# Patient Record
Sex: Female | Born: 1980 | Race: White | Hispanic: No | Marital: Married | Smoking: Never smoker
Health system: Southern US, Community
[De-identification: ages and names within clinical notes are randomized; demographics above are authoritative.]

## PROBLEM LIST (undated history)

## (undated) ENCOUNTER — Inpatient Hospital Stay (HOSPITAL_COMMUNITY): Payer: Self-pay

## (undated) DIAGNOSIS — K5900 Constipation, unspecified: Secondary | ICD-10-CM

## (undated) DIAGNOSIS — Z8619 Personal history of other infectious and parasitic diseases: Secondary | ICD-10-CM

## (undated) DIAGNOSIS — E7212 Methylenetetrahydrofolate reductase deficiency: Secondary | ICD-10-CM

## (undated) DIAGNOSIS — E039 Hypothyroidism, unspecified: Secondary | ICD-10-CM

## (undated) DIAGNOSIS — K760 Fatty (change of) liver, not elsewhere classified: Secondary | ICD-10-CM

## (undated) DIAGNOSIS — E663 Overweight: Secondary | ICD-10-CM

## (undated) DIAGNOSIS — M7989 Other specified soft tissue disorders: Secondary | ICD-10-CM

## (undated) DIAGNOSIS — F419 Anxiety disorder, unspecified: Secondary | ICD-10-CM

## (undated) DIAGNOSIS — M549 Dorsalgia, unspecified: Secondary | ICD-10-CM

## (undated) DIAGNOSIS — O24419 Gestational diabetes mellitus in pregnancy, unspecified control: Secondary | ICD-10-CM

## (undated) DIAGNOSIS — E559 Vitamin D deficiency, unspecified: Secondary | ICD-10-CM

## (undated) HISTORY — DX: Gestational diabetes mellitus in pregnancy, unspecified control: O24.419

## (undated) HISTORY — DX: Fatty (change of) liver, not elsewhere classified: K76.0

## (undated) HISTORY — DX: Vitamin D deficiency, unspecified: E55.9

## (undated) HISTORY — DX: Dorsalgia, unspecified: M54.9

## (undated) HISTORY — DX: Other specified soft tissue disorders: M79.89

## (undated) HISTORY — DX: Anxiety disorder, unspecified: F41.9

## (undated) HISTORY — DX: Personal history of other infectious and parasitic diseases: Z86.19

## (undated) HISTORY — DX: Constipation, unspecified: K59.00

## (undated) HISTORY — DX: Overweight: E66.3

---

## 2003-08-13 ENCOUNTER — Ambulatory Visit (HOSPITAL_COMMUNITY): Admission: RE | Admit: 2003-08-13 | Discharge: 2003-08-13 | Payer: Self-pay | Admitting: Gastroenterology

## 2004-05-31 ENCOUNTER — Other Ambulatory Visit: Admission: RE | Admit: 2004-05-31 | Discharge: 2004-05-31 | Payer: Self-pay | Admitting: Obstetrics and Gynecology

## 2004-06-01 ENCOUNTER — Other Ambulatory Visit: Admission: RE | Admit: 2004-06-01 | Discharge: 2004-06-01 | Payer: Self-pay | Admitting: Obstetrics and Gynecology

## 2004-08-23 ENCOUNTER — Encounter: Admission: RE | Admit: 2004-08-23 | Discharge: 2004-08-23 | Payer: Self-pay | Admitting: Internal Medicine

## 2004-12-24 ENCOUNTER — Emergency Department (HOSPITAL_COMMUNITY): Admission: EM | Admit: 2004-12-24 | Discharge: 2004-12-24 | Payer: Self-pay | Admitting: Emergency Medicine

## 2005-04-25 ENCOUNTER — Other Ambulatory Visit: Admission: RE | Admit: 2005-04-25 | Discharge: 2005-04-25 | Payer: Self-pay | Admitting: Obstetrics and Gynecology

## 2005-07-18 HISTORY — PX: WISDOM TOOTH EXTRACTION: SHX21

## 2010-07-18 DIAGNOSIS — Z1589 Genetic susceptibility to other disease: Secondary | ICD-10-CM

## 2010-07-18 HISTORY — DX: Genetic susceptibility to other disease: Z15.89

## 2013-08-08 ENCOUNTER — Emergency Department (INDEPENDENT_AMBULATORY_CARE_PROVIDER_SITE_OTHER)
Admission: EM | Admit: 2013-08-08 | Discharge: 2013-08-08 | Disposition: A | Discharge status: Home / Self Care | Payer: Worker's Compensation | Source: Home / Self Care | Attending: Family Medicine | Admitting: Family Medicine

## 2013-08-08 ENCOUNTER — Encounter (HOSPITAL_COMMUNITY): Payer: Self-pay | Admitting: Emergency Medicine

## 2013-08-08 DIAGNOSIS — S239XXA Sprain of unspecified parts of thorax, initial encounter: Secondary | ICD-10-CM

## 2013-08-08 DIAGNOSIS — S29019A Strain of muscle and tendon of unspecified wall of thorax, initial encounter: Secondary | ICD-10-CM

## 2013-08-08 MED ORDER — METHOCARBAMOL 500 MG PO TABS: 500.0000 mg | ORAL_TABLET | Freq: Two times a day (BID) | ORAL | Status: DC

## 2013-08-08 MED ORDER — IBUPROFEN 800 MG PO TABS: 800.0000 mg | ORAL_TABLET | Freq: Three times a day (TID) | ORAL | Status: DC

## 2013-08-08 NOTE — ED Notes (Signed)
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

## 2013-08-08 NOTE — Discharge Instructions (Signed)
Back Pain, Adult Low back pain is very common. About 1 in 5 people have back pain.The cause of low back pain is rarely dangerous. The pain often gets better over time.About half of people with a sudden onset of back pain feel better in just 2 weeks. About 8 in 10 people feel better by 6 weeks.  CAUSES Some common causes of back pain include:  Strain of the muscles or ligaments supporting the spine.  Wear and tear (degeneration) of the spinal discs.  Arthritis.  Direct injury to the back. DIAGNOSIS Most of the time, the direct cause of low back pain is not known.However, back pain can be treated effectively even when the exact cause of the pain is unknown.Answering your caregiver's questions about your overall health and symptoms is one of the most accurate ways to make sure the cause of your pain is not dangerous. If your caregiver needs more information, he or she may order lab work or imaging tests (X-rays or MRIs).However, even if imaging tests show changes in your back, this usually does not require surgery. HOME CARE INSTRUCTIONS For many people, back pain returns.Since low back pain is rarely dangerous, it is often a condition that people can learn to manageon their own.   Remain active. It is stressful on the back to sit or stand in one place. Do not sit, drive, or stand in one place for more than 30 minutes at a time. Take short walks on level surfaces as soon as pain allows.Try to increase the length of time you walk each day.  Do not stay in bed.Resting more than 1 or 2 days can delay your recovery.  Do not avoid exercise or work.Your body is made to move.It is not dangerous to be active, even though your back may hurt.Your back will likely heal faster if you return to being active before your pain is gone.  Pay attention to your body when you bend and lift. Many people have less discomfortwhen lifting if they bend their knees, keep the load close to their bodies,and  avoid twisting. Often, the most comfortable positions are those that put less stress on your recovering back.  Find a comfortable position to sleep. Use a firm mattress and lie on your side with your knees slightly bent. If you lie on your back, put a pillow under your knees.  Only take over-the-counter or prescription medicines as directed by your caregiver. Over-the-counter medicines to reduce pain and inflammation are often the most helpful.Your caregiver may prescribe muscle relaxant drugs.These medicines help dull your pain so you can more quickly return to your normal activities and healthy exercise.  Put ice on the injured area.  Put ice in a plastic bag.  Place a towel between your skin and the bag.  Leave the ice on for 15-20 minutes, 03-04 times a day for the first 2 to 3 days. After that, ice and heat may be alternated to reduce pain and spasms.  Ask your caregiver about trying back exercises and gentle massage. This may be of some benefit.  Avoid feeling anxious or stressed.Stress increases muscle tension and can worsen back pain.It is important to recognize when you are anxious or stressed and learn ways to manage it.Exercise is a great option. SEEK MEDICAL CARE IF:  You have pain that is not relieved with rest or medicine.  You have pain that does not improve in 1 week.  You have new symptoms.  You are generally not feeling well. SEEK   IMMEDIATE MEDICAL CARE IF:   You have pain that radiates from your back into your legs.  You develop new bowel or bladder control problems.  You have unusual weakness or numbness in your arms or legs.  You develop nausea or vomiting.  You develop abdominal pain.  You feel faint. Document Released: 07/04/2005 Document Revised: 01/03/2012 Document Reviewed: 11/22/2010 ExitCare Patient Information 2014 [employer].  

## 2013-08-08 NOTE — ED Provider Notes (Signed)
Medical screening examination/treatment/procedure(s) were performed by resident physician or non-physician practitioner and as supervising physician I was immediately available for consultation/collaboration.   Parveen Freehling DOUGLAS MD.   Jovonte Commins D Marin Milley, MD 08/08/13 2003 

## 2013-08-08 NOTE — ED Provider Notes (Signed)
CSN: [id number]     Arrival date & time 08/08/13  1648 History   First MD Initiated Contact with Patient 08/08/13 1803     Chief Complaint  Patient presents with  . Back Injury   (Consider location/radiation/quality/duration/timing/severity/associated sxs/prior Treatment) Patient is a 33 y.o. female presenting with back pain. The history is provided by the patient. No language interpreter was used.  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 History reviewed. No pertinent past medical history. No past surgical history on file. No family history on file. History  Substance Use Topics  . Smoking status: Not on file  . Smokeless tobacco: Not on file  . Alcohol Use: Not on file   OB History   Grav Para Term Preterm Abortions TAB SAB Ect Mult Living                 Review of Systems  Musculoskeletal: Positive for back pain.  All other systems reviewed and are negative.    Allergies  Review of patient's allergies indicates no known allergies.  Home Medications  No current outpatient prescriptions on file. BP 116/75  Pulse 80  Temp(Src) 98.7 F (37.1 C) (Oral)  Resp 16  SpO2 100%  LMP 07/13/2013 Physical Exam  Nursing note and vitals reviewed. Constitutional: She is oriented to person, place, and time. She appears well-developed and well-nourished.  HENT:  Head: Normocephalic and atraumatic.  Nose: Nose normal.  Mouth/Throat: Oropharynx is clear and moist.  Eyes: EOM are normal. Pupils are equal, round, and reactive to light.  Neck: Normal range of motion.  Cardiovascular: Normal rate.   Pulmonary/Chest: Effort normal and breath sounds normal.  Abdominal:  Soft. Bowel sounds are normal. She exhibits no distension.  Musculoskeletal: Normal range of motion.  Neurological: She is alert and oriented to person, place, and time.  Skin: Skin is warm.  Psychiatric: She has a normal mood and affect.    ED Course  Procedures (including critical care time) Labs Review Labs Reviewed - No data to display Imaging Review No results found.  EKG Interpretation    Date/Time:    Ventricular Rate:    PR Interval:    QRS Duration:   QT Interval:    QTC Calculation:   R Axis:     Text Interpretation:              MDM   1. Thoracic myofascial strain    Pt given rx for ibuprofen and robaxin.  Pt advised light duty.   Recheck in 1 week if not improved   Elson AreasLeslie K Patrisia Faeth, [place] JerseyPA-C 08/08/13 1836

## 2014-04-09 LAB — OB RESULTS CONSOLE RPR: RPR: NONREACTIVE

## 2014-04-09 LAB — OB RESULTS CONSOLE ANTIBODY SCREEN: Antibody Screen: NEGATIVE

## 2014-04-09 LAB — OB RESULTS CONSOLE RUBELLA ANTIBODY, IGM: RUBELLA: IMMUNE

## 2014-04-09 LAB — OB RESULTS CONSOLE ABO/RH: RH Type: NEGATIVE

## 2014-04-09 LAB — OB RESULTS CONSOLE HIV ANTIBODY (ROUTINE TESTING): HIV: NONREACTIVE

## 2014-04-09 LAB — OB RESULTS CONSOLE HEPATITIS B SURFACE ANTIGEN: Hepatitis B Surface Ag: NEGATIVE

## 2014-04-09 LAB — OB RESULTS CONSOLE GC/CHLAMYDIA
CHLAMYDIA, DNA PROBE: NEGATIVE
Gonorrhea: NEGATIVE

## 2014-04-09 LAB — OB RESULTS CONSOLE VARICELLA ZOSTER ANTIBODY, IGG: Varicella: IMMUNE

## 2014-07-04 ENCOUNTER — Inpatient Hospital Stay (HOSPITAL_COMMUNITY)
Admission: AD | Admit: 2014-07-04 | Discharge: 2014-07-04 | Disposition: A | Discharge status: Home / Self Care | Payer: Medicaid Other | Source: Ambulatory Visit | Attending: Obstetrics and Gynecology | Admitting: Obstetrics and Gynecology

## 2014-07-04 ENCOUNTER — Encounter (HOSPITAL_COMMUNITY): Payer: Self-pay | Admitting: *Deleted

## 2014-07-04 DIAGNOSIS — E669 Obesity, unspecified: Secondary | ICD-10-CM | POA: Diagnosis present

## 2014-07-04 DIAGNOSIS — Z3A20 20 weeks gestation of pregnancy: Secondary | ICD-10-CM | POA: Insufficient documentation

## 2014-07-04 DIAGNOSIS — Z1589 Genetic susceptibility to other disease: Secondary | ICD-10-CM | POA: Diagnosis present

## 2014-07-04 DIAGNOSIS — M79606 Pain in leg, unspecified: Secondary | ICD-10-CM

## 2014-07-04 DIAGNOSIS — M25561 Pain in right knee: Secondary | ICD-10-CM | POA: Diagnosis present

## 2014-07-04 DIAGNOSIS — O360920 Maternal care for other rhesus isoimmunization, second trimester, not applicable or unspecified: Secondary | ICD-10-CM | POA: Diagnosis not present

## 2014-07-04 DIAGNOSIS — M25569 Pain in unspecified knee: Secondary | ICD-10-CM | POA: Diagnosis present

## 2014-07-04 DIAGNOSIS — O26899 Other specified pregnancy related conditions, unspecified trimester: Secondary | ICD-10-CM

## 2014-07-04 DIAGNOSIS — E039 Hypothyroidism, unspecified: Secondary | ICD-10-CM | POA: Diagnosis not present

## 2014-07-04 DIAGNOSIS — O9989 Other specified diseases and conditions complicating pregnancy, childbirth and the puerperium: Secondary | ICD-10-CM | POA: Insufficient documentation

## 2014-07-04 DIAGNOSIS — Z6791 Unspecified blood type, Rh negative: Secondary | ICD-10-CM | POA: Diagnosis present

## 2014-07-04 DIAGNOSIS — O99212 Obesity complicating pregnancy, second trimester: Secondary | ICD-10-CM | POA: Diagnosis not present

## 2014-07-04 DIAGNOSIS — E559 Vitamin D deficiency, unspecified: Secondary | ICD-10-CM | POA: Diagnosis not present

## 2014-07-04 DIAGNOSIS — E7212 Methylenetetrahydrofolate reductase deficiency: Secondary | ICD-10-CM | POA: Diagnosis present

## 2014-07-04 DIAGNOSIS — O99282 Endocrine, nutritional and metabolic diseases complicating pregnancy, second trimester: Secondary | ICD-10-CM | POA: Insufficient documentation

## 2014-07-04 HISTORY — DX: Methylenetetrahydrofolate reductase deficiency: E72.12

## 2014-07-04 HISTORY — DX: Hypothyroidism, unspecified: E03.9

## 2014-07-04 NOTE — Discharge Instructions (Signed)
Take Ibuprophen 600 mg by mouth (3 200 mg tabs) every 6 hours for 24-36 hours. Apply topical gels/rubs for comfort. Call with any worsening of symptoms.  Knee Pain The knee is the complex joint between your thigh and your lower leg. It is made up of bones, tendons, ligaments, and cartilage. The bones that make up the knee are:  The femur in the thigh.  The tibia and fibula in the lower leg.  The patella or kneecap riding in the groove on the lower femur. CAUSES  Knee pain is a common complaint with many causes. A few of these causes are:  Injury, such as:  A ruptured ligament or tendon injury.  Torn cartilage.  Medical conditions, such as:  Gout  Arthritis  Infections  Overuse, over training, or overdoing a physical activity. Knee pain can be minor or severe. Knee pain can accompany debilitating injury. Minor knee problems often respond well to self-care measures or get well on their own. More serious injuries may need medical intervention or even surgery. SYMPTOMS The knee is complex. Symptoms of knee problems can vary widely. Some of the problems are:  Pain with movement and weight bearing.  Swelling and tenderness.  Buckling of the knee.  Inability to straighten or extend your knee.  Your knee locks and you cannot straighten it.  Warmth and redness with pain and fever.  Deformity or dislocation of the kneecap. TREATMENT The treatment of knee problems depends on the cause. Some of these treatments are:  Depending on the injury, proper casting, splinting, surgery, or physical therapy care will be needed.  Give yourself adequate recovery time. Do not overuse your joints. If you begin to get sore during workout routines, back off. Slow down or do fewer repetitions.  For repetitive activities such as cycling or running, maintain your strength and nutrition.  Alternate muscle groups. For example, if you are a weight lifter, work the upper body on one day and the  lower body the next.  Either tight or weak muscles do not give the proper support for your knee. Tight or weak muscles do not absorb the stress placed on the knee joint. Keep the muscles surrounding the knee strong.  Take care of mechanical problems.  If you have flat feet, orthotics or special shoes may help. See your caregiver if you need help.  Arch supports, sometimes with wedges on the inner or outer aspect of the heel, can help. These can shift pressure away from the side of the knee most bothered by osteoarthritis.  A brace called an "unloader" brace also may be used to help ease the pressure on the most arthritic side of the knee.  If your caregiver has prescribed crutches, braces, wraps or ice, use as directed. The acronym for this is PRICE. This means protection, rest, ice, compression, and elevation.  Nonsteroidal anti-inflammatory drugs (NSAIDs), can help relieve pain. But if taken immediately after an injury, they may actually increase swelling. Take NSAIDs with food in your stomach. Stop them if you develop stomach problems. Do not take these if you have a history of ulcers, stomach pain, or bleeding from the bowel. Do not take without your caregiver's approval if you have problems with fluid retention, heart failure, or kidney problems.  For ongoing knee problems, physical therapy may be helpful.  Topical painkillers. Applying certain ointments to your skin may help relieve the pain and stiffness of osteoarthritis. Ask your pharmacist for suggestions. Many over the-counter products are approved for temporary  relief of arthritis pain.  In some countries, doctors often prescribe topical NSAIDs for relief of chronic conditions such as arthritis and tendinitis. A review of treatment with NSAID creams found that they worked as well as oral medications but without the serious side effects. PREVENTION  Maintain a healthy weight. Extra pounds put more strain on your joints.  Get  strong, stay limber. Weak muscles are a common cause of knee injuries. Stretching is important. Include flexibility exercises in your workouts.  Be smart about exercise. If you have osteoarthritis, chronic knee pain or recurring injuries, you may need to change the way you exercise. This does not mean you have to stop being active. If your knees ache after jogging or playing basketball, consider switching to swimming, water aerobics, or other low-impact activities, at least for a few days a week. Sometimes limiting high-impact activities will provide relief.  Make sure your shoes fit well. Choose footwear that is right for your sport.  Protect your knees. Use the proper gear for knee-sensitive activities. Use kneepads when playing volleyball or laying carpet. Buckle your seat belt every time you drive. Most shattered kneecaps occur in car accidents.  Rest when you are tired. SEEK MEDICAL CARE IF:  You have knee pain that is continual and does not seem to be getting better.  SEEK IMMEDIATE MEDICAL CARE IF:  Your knee joint feels hot to the touch and you have a high fever. MAKE SURE YOU:   Understand these instructions.  Will watch your condition.  Will get help right away if you are not doing well or get worse. Document Released: 05/01/2007 Document Revised: 09/26/2011 Document Reviewed: 05/01/2007 [facility] Patient Information 2015 [place], [place]. This information is not intended to replace advice given to you by your health care provider. Make sure you discuss any questions you have with your health care provider.

## 2014-07-04 NOTE — MAU Note (Signed)
Pain behind R knee for last 2 days, pain became worse last night & is continuous - also hurts into thigh & calf today, concerned about DVT.  Denies uc's, bleeding, or LOF.

## 2014-07-04 NOTE — Progress Notes (Signed)
VASCULAR LAB PRELIMINARY  PRELIMINARY  PRELIMINARY  PRELIMINARY  Right lower extremity venous duplex completed.    Preliminary report:  Right:  No evidence of DVT, superficial thrombosis, or Baker's cyst.  Randale Carvalho, RVS 07/04/2014, 5:34 PM

## 2014-07-04 NOTE — MAU Note (Signed)
Urine in lab 

## 2014-07-04 NOTE — MAU Provider Note (Signed)
History   33 yo G1P0 at 7120 5/7 weeks presented unannounced c/o pain behind right knee.  Started yesterday as intermittent soreness, more constant today.  More noticeable with standing.  Denies edema or trauma to area.  Hx of similar sx in left knee prior to pregnancy.  "Concerned about DVT".  Patient Active Problem List   Diagnosis Date Noted  . Knee pain 07/04/2014  . Hypothyroidism 07/04/2014  . 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 Vitamin D deficiency 07/04/2014  . Obesity 07/04/2014  . Rh negative state in antepartum period 07/04/2014    Chief Complaint  Patient presents with  . Leg Pain   HPI:  See above  OB History    Gravida Para Term Preterm AB TAB SAB Ectopic Multiple Living   1               Past Medical History  Diagnosis Date  . Hypothyroidism   . Homozygous for MTHFR gene mutation 2012    Past Surgical History  Procedure Laterality Date  . Wisdom tooth extraction Left 2007    History reviewed. No pertinent family history.  History  Substance Use Topics  . Smoking status: Never Smoker   . Smokeless tobacco: Never Used  . Alcohol Use: No    Allergies: No Known Allergies  No prescriptions prior to admission    ROS:  Pain in right knee, +FM. Physical Exam   Blood pressure 99/63, pulse 88, temperature 98 F (36.7 C), temperature source Oral, resp. rate 18, last menstrual period 07/13/2013, SpO2 99 %.  Physical Exam  In NAD Chest clear Heart RRR without murmur Abd gravid, NT Pelvic deferred Ext--Right leg without edema.  Mildly painful behind right knee over palpable ligament.  Full ROM noted, no swelling of joint.  DTR 1+, no clonus Left leg WNL.   Full pulses bilaterally from femoral to dorsalis pedis  Doppler study:  No evidence of DVT, superficial thrombosis, or Baker's cyst.  ED Course  Assessment: IUP at 20 5/7 weeks Right knee pain--likely ligamentous in origin  Plan: D/C home. Ibuprophen 600 mg po q 6 hours x 24  hours. Topical pain relief gel/rub to area F/u if sx worsen.  May need referral to ortho if persists.   Nigel BridgemanLATHAM, Romonda Parker CNM, MSN 07/04/2014 6:35 PM

## 2014-07-18 NOTE — L&D Delivery Note (Signed)
Delivery Note At 3:41 AM a viable female "Lori Grant" was delivered via Vaginal, Spontaneous Delivery (Presentation: OA restituting to Right Occiput Anterior).  APGARS: ; weight pending.   Placenta status: Spontaneous, intact.  Cord: 3 vessels with the following complications: Thick meconium  Cord pH: NA - crying at birth.  Anesthesia: Local for repair Epidural Episiotomy: None Lacerations: 2nd degree perineal   Suture Repair: 3.0 vicryl SH Est. Blood Loss (mL):    Mom to postpartum.  Baby to Couplet care / Skin to Skin.  Placenta to path due to prolonged ROM, 42.0 wks, thick mec.  Mom breastfeeding.  Undecided re: birth control.  No circumcision.  Lori Grant, Lori Grant 11/30/2014, 4:51 AM

## 2014-07-22 ENCOUNTER — Other Ambulatory Visit: Payer: Self-pay | Admitting: Obstetrics and Gynecology

## 2014-07-22 DIAGNOSIS — E049 Nontoxic goiter, unspecified: Secondary | ICD-10-CM

## 2014-07-25 ENCOUNTER — Ambulatory Visit
Admission: RE | Admit: 2014-07-25 | Discharge: 2014-07-25 | Disposition: A | Discharge status: Home / Self Care | Payer: Medicaid Other | Source: Ambulatory Visit | Attending: Obstetrics and Gynecology | Admitting: Obstetrics and Gynecology

## 2014-07-25 DIAGNOSIS — E049 Nontoxic goiter, unspecified: Secondary | ICD-10-CM

## 2014-07-28 ENCOUNTER — Other Ambulatory Visit: Payer: Self-pay

## 2014-08-15 ENCOUNTER — Encounter (HOSPITAL_COMMUNITY): Payer: Self-pay | Admitting: *Deleted

## 2014-08-15 ENCOUNTER — Inpatient Hospital Stay (HOSPITAL_COMMUNITY)
Admission: AD | Admit: 2014-08-15 | Discharge: 2014-08-15 | Disposition: A | Discharge status: Home / Self Care | Payer: Medicaid Other | Source: Ambulatory Visit | Attending: Obstetrics and Gynecology | Admitting: Obstetrics and Gynecology

## 2014-08-15 DIAGNOSIS — O99612 Diseases of the digestive system complicating pregnancy, second trimester: Secondary | ICD-10-CM | POA: Diagnosis not present

## 2014-08-15 DIAGNOSIS — O26892 Other specified pregnancy related conditions, second trimester: Secondary | ICD-10-CM

## 2014-08-15 DIAGNOSIS — O99282 Endocrine, nutritional and metabolic diseases complicating pregnancy, second trimester: Secondary | ICD-10-CM | POA: Insufficient documentation

## 2014-08-15 DIAGNOSIS — Z3A26 26 weeks gestation of pregnancy: Secondary | ICD-10-CM | POA: Insufficient documentation

## 2014-08-15 DIAGNOSIS — K219 Gastro-esophageal reflux disease without esophagitis: Secondary | ICD-10-CM | POA: Diagnosis not present

## 2014-08-15 DIAGNOSIS — R079 Chest pain, unspecified: Secondary | ICD-10-CM | POA: Diagnosis present

## 2014-08-15 DIAGNOSIS — O360920 Maternal care for other rhesus isoimmunization, second trimester, not applicable or unspecified: Secondary | ICD-10-CM | POA: Insufficient documentation

## 2014-08-15 DIAGNOSIS — E039 Hypothyroidism, unspecified: Secondary | ICD-10-CM | POA: Diagnosis not present

## 2014-08-15 DIAGNOSIS — R12 Heartburn: Secondary | ICD-10-CM

## 2014-08-15 DIAGNOSIS — E559 Vitamin D deficiency, unspecified: Secondary | ICD-10-CM | POA: Insufficient documentation

## 2014-08-15 LAB — URINALYSIS, ROUTINE W REFLEX MICROSCOPIC
BILIRUBIN URINE: NEGATIVE
GLUCOSE, UA: NEGATIVE mg/dL
KETONES UR: NEGATIVE mg/dL
Nitrite: NEGATIVE
Protein, ur: NEGATIVE mg/dL
Specific Gravity, Urine: 1.01 (ref 1.005–1.030)
UROBILINOGEN UA: 0.2 mg/dL (ref 0.0–1.0)
pH: 7 (ref 5.0–8.0)

## 2014-08-15 LAB — COMPREHENSIVE METABOLIC PANEL
ALBUMIN: 3.2 g/dL — AB (ref 3.5–5.2)
ALT: 19 U/L (ref 0–35)
ANION GAP: 6 (ref 5–15)
AST: 16 U/L (ref 0–37)
Alkaline Phosphatase: 90 U/L (ref 39–117)
BILIRUBIN TOTAL: 0.5 mg/dL (ref 0.3–1.2)
BUN: 7 mg/dL (ref 6–23)
CALCIUM: 8.9 mg/dL (ref 8.4–10.5)
CO2: 21 mmol/L (ref 19–32)
Chloride: 108 mmol/L (ref 96–112)
Creatinine, Ser: 0.51 mg/dL (ref 0.50–1.10)
GFR calc Af Amer: >90 mL/min (ref >90)
GLUCOSE: 85 mg/dL (ref 70–99)
POTASSIUM: 3.9 mmol/L (ref 3.5–5.1)
Sodium: 135 mmol/L (ref 135–145)
Total Protein: 6.9 g/dL (ref 6.0–8.3)

## 2014-08-15 LAB — URINE MICROSCOPIC-ADD ON

## 2014-08-15 LAB — CBC
HCT: 34.9 % — ABNORMAL LOW (ref 36.0–46.0)
HEMOGLOBIN: 12.3 g/dL (ref 12.0–15.0)
MCH: 30.8 pg (ref 26.0–34.0)
MCHC: 35.2 g/dL (ref 30.0–36.0)
MCV: 87.3 fL (ref 78.0–100.0)
Platelets: 233 10*3/uL (ref 150–400)
RBC: 4 MIL/uL (ref 3.87–5.11)
RDW: 13.3 % (ref 11.5–15.5)
WBC: 9 10*3/uL (ref 4.0–10.5)

## 2014-08-15 LAB — LIPASE, BLOOD: LIPASE: 31 U/L (ref 11–59)

## 2014-08-15 LAB — AMYLASE: AMYLASE: 70 U/L (ref 0–105)

## 2014-08-15 NOTE — MAU Provider Note (Signed)
History    Lori Grant is a 34 y.o. G1P0 at 26.5wks who presents, after phone call, with reports of chest pain.  Patient reports pain started yesterday 30-45 minutes after dinner, was "really severe," and occurred intermittently for about 2-3 hours. Patient describes pain as tightness and pressure "like someone was pushing down" and lasted about 2-3 minutes per episode.  Patient denies current pain, SOB, "no arm pain," N/V, or issues with bowel movements.   Patient attempted to use apple cider vinegar, lemon juice, almonds, and almond milk with no relief.  Patient states that after using papaya enzyme she finally got relief. Has not attempted any rx medications and "prefers digestive enzymes or probiotics," which she has used in the past with good results but not during pregnancy.  Patient states she had episode this morning, after breakfast, but it was not as severe as last night.   Patient current medications include armour bid, pnv, vit d &k, and folic acid. No other issues.  Good fetal movement, no vaginal bleeding, LoF, or contractions.  No known issues with gallbladder, but mother has history of gallstones.    Patient Active Problem List   Diagnosis Date Noted  . Knee pain 07/04/2014  . Hypothyroidism 07/04/2014  . 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 Vitamin D deficiency 07/04/2014  . Obesity 07/04/2014  . Rh negative state in antepartum period 07/04/2014    No chief complaint on file.  HPI  OB History    Gravida Para Term Preterm AB TAB SAB Ectopic Multiple Living   1               Past Medical History  Diagnosis Date  . Hypothyroidism   . Homozygous for MTHFR gene mutation 2012    Past Surgical History  Procedure Laterality Date  . Wisdom tooth extraction Left 2007    No family history on file.  History  Substance Use Topics  . Smoking status: Never Smoker   . Smokeless tobacco: Never Used  . Alcohol Use: No    Allergies: No Known  Allergies  Prescriptions prior to admission  Medication Sig Dispense Refill Last Dose  . Cholecalciferol (VITAMIN D-3) 1000 UNITS CAPS Take 1 capsule by mouth daily.   08/14/2014 at Unknown time  . Digestive Enzymes (PAPAYA ENZYME) CHEW Chew 4 each by mouth daily as needed (heartburn).    08/15/2014 at Unknown time  . Folic Acid-Vit B6-Vit B12 (FOLBEE) 2.5-25-1 MG TABS tablet Take 1 tablet by mouth 2 (two) times daily.   08/15/2014 at Unknown time  . MAGNESIUM PO Take 1 tablet by mouth daily.   Past Week at Unknown time  . Prenatal Vit-Fe Fumarate-FA (PRENATAL MULTIVITAMIN) TABS tablet Take 1 tablet by mouth at bedtime.    08/14/2014 at Unknown time  . thyroid (ARMOUR) 30 MG tablet Take 30-60 mg by mouth 2 (two) times daily.  in the morning and 30 mg at lunch time   08/15/2014 at Unknown time  . Vitamin K, Phytonadione, 100 MCG TABS Take 1 tablet by mouth daily.   08/14/2014 at Unknown time    Review of Systems  Constitutional: Negative for fever and weight loss.  Cardiovascular: Positive for chest pain. Negative for palpitations.  Gastrointestinal: Positive for heartburn. Negative for nausea, vomiting, abdominal pain, diarrhea and constipation.  Genitourinary: Negative.   Musculoskeletal: Negative for myalgias and back pain.  Neurological: Negative for dizziness and headaches.    See HPI Above  Food Recall: Dinner: Chicken tacos with  onions and peppers. Breakfast: Oatmeal, banana, yogurt. Physical Exam   Blood pressure 112/73, pulse 98, temperature 97.9 F (36.6 C), temperature source Oral, resp. rate 18, last menstrual period 07/13/2013, SpO2 99 %.  Results for orders placed or performed during the hospital encounter of 08/15/14 (from the past 24 hour(s))  Urinalysis, Routine w reflex microscopic     Status: Abnormal   Collection Time: 08/15/14  2:50 PM  Result Value Ref Range   Color, Urine YELLOW YELLOW   APPearance CLEAR CLEAR   Specific Gravity, Urine 1.010 1.005 - 1.030   pH  7.0 5.0 - 8.0   Glucose, UA NEGATIVE NEGATIVE mg/dL   Hgb urine dipstick TRACE (A) NEGATIVE   Bilirubin Urine NEGATIVE NEGATIVE   Ketones, ur NEGATIVE NEGATIVE mg/dL   Protein, ur NEGATIVE NEGATIVE mg/dL   Urobilinogen, UA 0.2 0.0 - 1.0 mg/dL   Nitrite NEGATIVE NEGATIVE   Leukocytes, UA TRACE (A) NEGATIVE  Urine microscopic-add on     Status: Abnormal   Collection Time: 08/15/14  2:50 PM  Result Value Ref Range   Squamous Epithelial / LPF FEW (A) RARE   WBC, UA 0-2 <3 WBC/hpf   RBC / HPF 0-2 <3 RBC/hpf  CBC     Status: Abnormal   Collection Time: 08/15/14  4:29 PM  Result Value Ref Range   WBC 9.0 4.0 - 10.5 K/uL   RBC 4.00 3.87 - 5.11 MIL/uL   Hemoglobin 12.3 12.0 - 15.0 g/dL   HCT 40.934.9 (L) 81.136.0 - 91.446.0 %   MCV 87.3 78.0 - 100.0 fL   MCH 30.8 26.0 - 34.0 pg   MCHC 35.2 30.0 - 36.0 g/dL   RDW 78.213.3 95.611.5 - 21.315.5 %   Platelets 233 150 - 400 K/uL  Amylase     Status: None   Collection Time: 08/15/14  4:29 PM  Result Value Ref Range   Amylase 70 0 - 105 U/L  Lipase, blood     Status: None   Collection Time: 08/15/14  4:29 PM  Result Value Ref Range   Lipase 31 11 - 59 U/L  Comprehensive metabolic panel     Status: Abnormal   Collection Time: 08/15/14  4:29 PM  Result Value Ref Range   Sodium 135 135 - 145 mmol/L   Potassium 3.9 3.5 - 5.1 mmol/L   Chloride 108 96 - 112 mmol/L   CO2 21 19 - 32 mmol/L   Glucose, Bld 85 70 - 99 mg/dL   BUN 7 6 - 23 mg/dL   Creatinine, Ser 0.860.51 0.50 - 1.10 mg/dL   Calcium 8.9 8.4 - 57.810.5 mg/dL   Total Protein 6.9 6.0 - 8.3 g/dL   Albumin 3.2 (L) 3.5 - 5.2 g/dL   AST 16 0 - 37 U/L   ALT 19 0 - 35 U/L   Alkaline Phosphatase 90 39 - 117 U/L   Total Bilirubin 0.5 0.3 - 1.2 mg/dL   GFR calc non Af Amer >90 >90 mL/min   GFR calc Af Amer >90 >90 mL/min   Anion gap 6 5 - 15     Physical Exam  Constitutional: She is oriented to person, place, and time. She appears well-developed and well-nourished. No distress.  HENT:  Head: Normocephalic and  atraumatic.  Eyes: EOM are normal.  Neck: Normal range of motion.  Cardiovascular: Normal rate, regular rhythm and normal heart sounds.   Respiratory: Effort normal and breath sounds normal.  GI: Soft. Bowel sounds are normal. She exhibits no  mass. There is no tenderness. There is no rebound and no guarding.  Genitourinary:  Deferred  Musculoskeletal: Normal range of motion.  Neurological: She is alert and oriented to person, place, and time.  Skin: Skin is warm and dry.  Psychiatric: She has a normal mood and affect.   FHR: 140 bpm, Mod Var, -Decels, +Accels UC: None graphed or palpated ED Course  Assessment: IUP at 26.5wks GERD Family H/O Gallstones  Plan: -PE as above -Labs: CBC, CMP, Amylase, Lipase -Discussed usage of tums or "organic" alternative for symptom mgmt short term (2-3 days). -Educated on GERD and proper management with medications -Educated on GERD appropriate diet and ways to reduce symptoms  Follow Up (1900) -CBC, CMP-WNL -Lipase/amylase pending -Patient has eaten small snacks, can not perform [place]  -Instructed to continue mgmt as discussed earlier -Discharge to home in stable condition -Will call with any abnormal results -Patient verbalized understanding, will follow up in office as scheduled or in MAU for abnormal results -Encouraged to call if any questions or concerns arise prior to next scheduled office visit.    Timohty Renbarger Jovita Kussmaul, MSN 08/15/2014 3:23 PM

## 2014-08-15 NOTE — MAU Note (Addendum)
Started having chest pain, tightness mid chest, about eating.  Felt like heartburn only a lot worse.  Has had some today, not near as bad.  Comes and goes. Felt like heart was racing at times,  Has not felt short of breath

## 2014-08-15 NOTE — Discharge Instructions (Signed)
Gastroesophageal Reflux Disease, Adult Gastroesophageal reflux disease (GERD) happens when acid from your stomach flows up into the esophagus. When acid comes in contact with the esophagus, the acid causes soreness (inflammation) in the esophagus. Over time, GERD may create small holes (ulcers) in the lining of the esophagus. CAUSES   Increased body weight. This puts pressure on the stomach, making acid rise from the stomach into the esophagus.  Smoking. This increases acid production in the stomach.  Drinking alcohol. This causes decreased pressure in the lower esophageal sphincter (valve or ring of muscle between the esophagus and stomach), allowing acid from the stomach into the esophagus.  Late evening meals and a full stomach. This increases pressure and acid production in the stomach.  A malformed lower esophageal sphincter. Sometimes, no cause is found. SYMPTOMS   Burning pain in the lower part of the mid-chest behind the breastbone and in the mid-stomach area. This may occur twice a week or more often.  Trouble swallowing.  Sore throat.  Dry cough.  Asthma-like symptoms including chest tightness, shortness of breath, or wheezing. DIAGNOSIS  Your caregiver may be able to diagnose GERD based on your symptoms. In some cases, X-rays and other tests may be done to check for complications or to check the condition of your stomach and esophagus. TREATMENT  Your caregiver may recommend over-the-counter or prescription medicines to help decrease acid production. Ask your caregiver before starting or adding any new medicines.  HOME CARE INSTRUCTIONS   Change the factors that you can control. Ask your caregiver for guidance concerning weight loss, quitting smoking, and alcohol consumption.  Avoid foods and drinks that make your symptoms worse, such as:  Caffeine or alcoholic drinks.  Chocolate.  Peppermint or mint flavorings.  Garlic and onions.  Spicy foods.  Citrus fruits,  such as oranges, lemons, or limes.  Tomato-based foods such as sauce, chili, salsa, and pizza.  Fried and fatty foods.  Avoid lying down for the 3 hours prior to your bedtime or prior to taking a nap.  Eat small, frequent meals instead of large meals.  Wear loose-fitting clothing. Do not wear anything tight around your waist that causes pressure on your stomach.  Raise the head of your bed 6 to 8 inches with wood blocks to help you sleep. Extra pillows will not help.  Only take over-the-counter or prescription medicines for pain, discomfort, or fever as directed by your caregiver.  Do not take aspirin, ibuprofen, or other nonsteroidal anti-inflammatory drugs (NSAIDs). SEEK IMMEDIATE MEDICAL CARE IF:   You have pain in your arms, neck, jaw, teeth, or back.  Your pain increases or changes in intensity or duration.  You develop nausea, vomiting, or sweating (diaphoresis).  You develop shortness of breath, or you faint.  Your vomit is green, yellow, black, or looks like coffee grounds or blood.  Your stool is red, bloody, or black. These symptoms could be signs of other problems, such as heart disease, gastric bleeding, or esophageal bleeding. MAKE SURE YOU:   Understand these instructions.  Will watch your condition.  Will get help right away if you are not doing well or get worse. Document Released: 04/13/2005 Document Revised: 09/26/2011 Document Reviewed: 01/21/2011 Exit[employer] Patient Information 2015 [employer]. This information is not intended to replace advice given to you by your health care provider. Make sure you discuss any questions you have with your health care provider.  

## 2014-10-20 LAB — OB RESULTS CONSOLE GBS: STREP GROUP B AG: NEGATIVE

## 2014-11-27 ENCOUNTER — Encounter (HOSPITAL_COMMUNITY): Payer: Self-pay | Admitting: *Deleted

## 2014-11-27 ENCOUNTER — Telehealth (HOSPITAL_COMMUNITY): Payer: Self-pay | Admitting: *Deleted

## 2014-11-27 NOTE — Telephone Encounter (Signed)
Preadmission screen  

## 2014-11-28 ENCOUNTER — Inpatient Hospital Stay (HOSPITAL_COMMUNITY)
Admission: AD | Admit: 2014-11-28 | Discharge: 2014-12-02 | DRG: 775 | Disposition: A | Discharge status: Home / Self Care | Payer: Medicaid Other | Source: Ambulatory Visit | Attending: Obstetrics and Gynecology | Admitting: Obstetrics and Gynecology

## 2014-11-28 ENCOUNTER — Encounter (HOSPITAL_COMMUNITY): Payer: Self-pay

## 2014-11-28 DIAGNOSIS — E039 Hypothyroidism, unspecified: Secondary | ICD-10-CM | POA: Diagnosis present

## 2014-11-28 DIAGNOSIS — O480 Post-term pregnancy: Secondary | ICD-10-CM | POA: Diagnosis present

## 2014-11-28 DIAGNOSIS — Z6838 Body mass index (BMI) 38.0-38.9, adult: Secondary | ICD-10-CM | POA: Diagnosis not present

## 2014-11-28 DIAGNOSIS — E559 Vitamin D deficiency, unspecified: Secondary | ICD-10-CM | POA: Diagnosis present

## 2014-11-28 DIAGNOSIS — E669 Obesity, unspecified: Secondary | ICD-10-CM | POA: Diagnosis present

## 2014-11-28 DIAGNOSIS — Z3A41 41 weeks gestation of pregnancy: Secondary | ICD-10-CM | POA: Diagnosis present

## 2014-11-28 DIAGNOSIS — Z6791 Unspecified blood type, Rh negative: Secondary | ICD-10-CM

## 2014-11-28 DIAGNOSIS — O3663X0 Maternal care for excessive fetal growth, third trimester, not applicable or unspecified: Secondary | ICD-10-CM | POA: Diagnosis present

## 2014-11-28 DIAGNOSIS — E7212 Methylenetetrahydrofolate reductase deficiency: Secondary | ICD-10-CM | POA: Diagnosis present

## 2014-11-28 DIAGNOSIS — Z3403 Encounter for supervision of normal first pregnancy, third trimester: Secondary | ICD-10-CM

## 2014-11-28 DIAGNOSIS — O99214 Obesity complicating childbirth: Secondary | ICD-10-CM | POA: Diagnosis present

## 2014-11-28 DIAGNOSIS — O4292 Full-term premature rupture of membranes, unspecified as to length of time between rupture and onset of labor: Principal | ICD-10-CM | POA: Diagnosis present

## 2014-11-28 DIAGNOSIS — O99284 Endocrine, nutritional and metabolic diseases complicating childbirth: Secondary | ICD-10-CM | POA: Diagnosis present

## 2014-11-28 LAB — CBC
HCT: 35 % — ABNORMAL LOW (ref 36.0–46.0)
Hemoglobin: 12.1 g/dL (ref 12.0–15.0)
MCH: 29.2 pg (ref 26.0–34.0)
MCHC: 34.6 g/dL (ref 30.0–36.0)
MCV: 84.5 fL (ref 78.0–100.0)
Platelets: 226 10*3/uL (ref 150–400)
RBC: 4.14 MIL/uL (ref 3.87–5.11)
RDW: 14 % (ref 11.5–15.5)
WBC: 10.2 10*3/uL (ref 4.0–10.5)

## 2014-11-28 LAB — POCT FERN TEST

## 2014-11-28 LAB — TYPE AND SCREEN
ABO/RH(D): O NEG
Antibody Screen: NEGATIVE

## 2014-11-28 LAB — RAPID HIV SCREEN (HIV 1/2 AB+AG)
HIV 1/2 Antibodies: NONREACTIVE
HIV-1 P24 ANTIGEN - HIV24: NONREACTIVE

## 2014-11-28 LAB — ABO/RH: ABO/RH(D): O NEG

## 2014-11-28 MED ORDER — LACTATED RINGERS IV SOLN
INTRAVENOUS | Status: DC
  Administered 2014-11-29 (×3): via INTRAVENOUS

## 2014-11-28 MED ORDER — OXYTOCIN 40 UNITS IN LACTATED RINGERS INFUSION - SIMPLE MED
62.5000 mL/h | INTRAVENOUS | Status: DC
  Administered 2014-11-30: 62.5 mL/h via INTRAVENOUS
  Filled 2014-11-28 (×2): qty 1000

## 2014-11-28 MED ORDER — OXYCODONE-ACETAMINOPHEN 5-325 MG PO TABS: 1.0000 | ORAL_TABLET | ORAL | Status: DC | PRN

## 2014-11-28 MED ORDER — LIDOCAINE HCL (PF) 1 % IJ SOLN
30.0000 mL | INTRAMUSCULAR | Status: DC | PRN
  Administered 2014-11-30: 30 mL via SUBCUTANEOUS
  Filled 2014-11-28: qty 30

## 2014-11-28 MED ORDER — OXYCODONE-ACETAMINOPHEN 5-325 MG PO TABS: 2.0000 | ORAL_TABLET | ORAL | Status: DC | PRN

## 2014-11-28 MED ORDER — ACETAMINOPHEN 325 MG PO TABS: 650.0000 mg | ORAL_TABLET | ORAL | Status: DC | PRN

## 2014-11-28 MED ORDER — LACTATED RINGERS IV SOLN: 500.0000 mL | INTRAVENOUS | Status: DC | PRN

## 2014-11-28 MED ORDER — ONDANSETRON HCL 4 MG/2ML IJ SOLN
4.0000 mg | Freq: Four times a day (QID) | INTRAMUSCULAR | Status: DC | PRN
  Administered 2014-11-29: 4 mg via INTRAVENOUS
  Filled 2014-11-28: qty 2

## 2014-11-28 MED ORDER — OXYTOCIN BOLUS FROM INFUSION
500.0000 mL | INTRAVENOUS | Status: DC
  Administered 2014-11-30: 500 mL via INTRAVENOUS

## 2014-11-28 MED ORDER — FLEET ENEMA 7-19 GM/118ML RE ENEM: 1.0000 | ENEMA | Freq: Every day | RECTAL | Status: DC | PRN

## 2014-11-28 MED ORDER — CITRIC ACID-SODIUM CITRATE 334-500 MG/5ML PO SOLN
30.0000 mL | ORAL | Status: DC | PRN
  Filled 2014-11-28: qty 15

## 2014-11-28 NOTE — Progress Notes (Signed)
VStandard, CNM notified pt's SROM, mod mec. Will come see pt and place orders.

## 2014-11-28 NOTE — Progress Notes (Addendum)
Lori HunSarah Grant is a 34 y.o. G1P0 at 41.5 weeks present to MAU c/o ctx x 4minutes lasting 20-30 sec.  Pt denies vb or lof w/+FM.     History     Patient Active Problem List   Diagnosis Date Noted  . Knee pain 07/04/2014  . Hypothyroidism 07/04/2014  . 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 Vitamin D deficiency 07/04/2014  . Obesity 07/04/2014  . Rh negative state in antepartum period 07/04/2014    Chief Complaint  Patient presents with  . Labor Eval   HPI  OB History    Gravida Para Term Preterm AB TAB SAB Ectopic Multiple Living   1               Past Medical History  Diagnosis Date  . Hypothyroidism   . Homozygous for MTHFR gene mutation 2012  . Hx of varicella     Past Surgical History  Procedure Laterality Date  . Wisdom tooth extraction Left 2007    Family History  Problem Relation Age of Onset  . Stroke Mother   . Cancer Maternal Grandmother     colon    History  Substance Use Topics  . Smoking status: Never Smoker   . Smokeless tobacco: Never Used  . Alcohol Use: No    Allergies: No Known Allergies  Prescriptions prior to admission  Medication Sig Dispense Refill Last Dose  . Prenatal Vit-Fe Fumarate-FA (PRENATAL MULTIVITAMIN) TABS tablet Take 1 tablet by mouth at bedtime.    11/27/2014 at Unknown time  . thyroid (ARMOUR) 30 MG tablet Take 30-60 mg by mouth 2 (two) times daily. 60mg  in the morning and 30 mg at lunch time   11/28/2014 at Unknown time  . Vitamin K, Phytonadione, 100 MCG TABS Take 1 tablet by mouth daily.   Past Week at Unknown time    ROS See HPI above, all other systems are negative  Physical Exam   Blood pressure 115/77, pulse 84, temperature 97.7 F (36.5 C), temperature source Oral, resp. rate 18, height 5\' 3"  (1.6 m), last menstrual period 07/13/2013.  Physical Exam Ext:  WNL ABD: Soft, non tender to palpation, no rebound or guarding SVE: 3/70/-3/posterior/soft   ED Course  Assessment: IUP at   41.5weeks Membranes: intact FHR: Category 1 CTX:  Irregular    Plan: Observe for 1 hour then recheck DC to home if unchanged  Serah Nicoletti, CNM, MSN 11/28/2014. 12:04 PM  Addendum AROM 1250 light mec Admit to L&D for expectant management PLANNING WATERBIRTH. ATTENDED CLASS, SIGNED CONSENTS X 2 10/06/14  Pt decline IV

## 2014-11-28 NOTE — MAU Note (Signed)
Pt here for contractions q4-7 minutes apart, no bleeding or lof.

## 2014-11-28 NOTE — Progress Notes (Signed)
  Subjective: Assumed care of this 34 yo G1P0 at 41.5 wks admitted due to SROM. Denies painful ctxs or VB. +FM. FOB and friend at bedside.  Objective: BP 136/78 mmHg  Pulse 82  Temp(Src) 97.4 F (36.3 C) (Oral)  Resp 20  Ht 5\' 3"  (1.6 m)  Wt 97.523 kg (215 lb)  BMI 38.09 kg/m2  LMP 07/13/2013     Today's Vitals   11/28/14 1932 11/28/14 2103 11/28/14 2325 11/29/14 0130  BP: 136/78  123/83   Pulse: 82  82   Temp:   97.8 F (36.6 C) 98 F (36.7 C)  TempSrc:   Oral Oral  Resp: 20  20   Height:      Weight:      PainSc: 5  7  8      FHT: Category 1 - intermittent monitoring UC:   irregular SVE:   Dilation: 3 Effacement (%): 70 Station: 0 Exam by:: Kwilliams, CNM  Assessment:  IUP at term Late term pregnancy GBS neg Rh neg Hypothyroidism MTHFR mutation Vitamin D deficiency  Plan: Ok for hydrotherapy  Sherre ScarletWILLIAMS, Cristian Davitt CNM 11/28/2014, 11:50 PM

## 2014-11-28 NOTE — H&P (Signed)
Lori Grant is a 34 y.o. female, G1 P0 at 41.5 weeks presented to MAU for ctx from early labor.  PROM in MAU at 1250 light mec was noted.  Pt denies vb at this time.  Pt desires a waterbirth and has a doula.   Patient Active Problem List   Diagnosis Date Noted  . Labor and delivery indication for care or intervention 11/28/2014  . Knee pain 07/04/2014  . Hypothyroidism 07/04/2014  . 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 Vitamin D deficiency 07/04/2014  . Obesity 07/04/2014  . Rh negative state in antepartum period 07/04/2014    Pregnancy Course: Patient entered care at 12.2 weeks.   EDC of 11/16/14 was established by LMP.      US evaluations:    20.5 weeks - Anatomy: EFW 10oz - 53%, FHR 153, cervix 3.47, breech, posterior placenta, normal cordno abd seen, female     24.4 weeks - FU: EFW 1lb 14oz - 89%,  FHR 148, vertex,  Posterior placenta, normal fluid, heart view seen.  41.3 weeks - BPP AFI 13.7, BPP 8/8, FHR 141, vertex  Significant prenatal events:   Rh negative - rhogram given 09/10/14, MRHFR mutation, obesity, hypothryoidism Last evaluation:   41.3 weeks   VE:3/80/-2 on 11/26/14  Reason for admission:  PROM  Pt States:   Contractions Frequency: occassional         Contraction severity: moderate         Fetal activity: +FM  OB History    Gravida Para Term Preterm AB TAB SAB Ectopic Multiple Living   1              Past Medical History  Diagnosis Date  . Hypothyroidism   . Homozygous for MTHFR gene mutation 2012  . Hx of varicella    Past Surgical History  Procedure Laterality Date  . Wisdom tooth extraction Left 2007   Family History: family history includes Cancer in her maternal grandmother; Stroke in her mother. Social History:  reports that she has never smoked. She has never used smokeless tobacco. She reports that she does not drink alcohol or use illicit drugs.   Prenatal Transfer Tool  Maternal Diabetes: No Genetic Screening: Normal Maternal  Ultrasounds/Referrals: Normal Fetal Ultrasounds or other Referrals:  None Maternal Substance Abuse:  No Significant Maternal Medications:  Meds include: Syntroid Significant Maternal Lab Results: None   ROS:  See HPI above, all other systems are negative  No Known Allergies  Dilation: 3 Effacement (%): 70 Station: -3 Exam by:: V. Alezander Dimaano CNm Blood pressure 131/82, pulse 78, temperature 97.7 F (36.5 C), temperature source Oral, resp. rate 20, height 5\' 3"  (1.6 m), weight 215 lb (97.523 kg), last menstrual period 07/13/2013.  Maternal Exam:  Uterine Assessment: Contraction frequency is rare.  Abdomen: Gravid, non tender. Fundal height is aga.  Normal external genitalia, vulva, cervix, uterus and adnexa.  No lesions noted on exam.  Pelvis adequate for delivery.  Fetal presentation: Vertex by VE  Fetal Exam:  Monitor Surveillance : Intermitting monitoring per  Mode: Ultrasound.  NICHD: Category 1 CTXs: Q 5-6 minutes EFW   7 lbs  Physical Exam: Nursing note and vitals reviewed General: alert and cooperative She appears well nourished Psychiatric: Normal mood and affect. Her behavior is normal Head: Normocephalic Eyes: Pupils are equal, round, and reactive to light Neck: Normal range of motion Cardiovascular: RRR without murmur  Respiratory: CTAB. Effort normal  Abd: soft, non-tender, +BS, no rebound, no guarding  Genitourinary:  Vagina normal  Neurological: A&Ox3 Skin: Warm and dry  Musculoskeletal: Normal range of motion  Homan's sign negative bilaterally No evidence of DVTs.  Edema: Minimal bilaterally non-pitting edema DTR: 2+ Clonus: None   Prenatal labs: ABO, Rh: O/Negative/-- (09/23 0000) Antibody: Negative (09/23 0000) Rubella:   immune RPR: Nonreactive (09/23 0000)  HBsAg: Negative (09/23 0000)  HIV: Non-reactive (09/23 0000)  GBS: Negative (04/04 0000) Sickle cell/Hgb electrophoresis:  WNL Pap:  wnl 04/09/24 GC:  negative  Chlamydia:  negaative Genetic screenings:   Glucola:  wnl  Assessment:  IUP at 41.5 weeks NICHD: Category Membranes: SROM x 2.5hrs Bishop Score: 3 GBS negative  Plan:  Admit to L&D for expectant management of labor. IV pain medication per orders PRN Epidural per patient request Foley cath after patient is comfortable with epidural Anticipate SVD  Labor mgmt as ordered Desires water birth  Okay to ambulate around unit with wireless monitors  Okay to get up and shower without monitoring   May auscultate FHR intermittently,  if expectant management     q 30 min in active labor - x 5 minutes     q 15 min in transition - before during and after a ctx     q 5 min with pushing - before during and after a ctx.     May ambulate without monitoring.     If no active labor, may do NST q 2 hours.       Attending MD available at all times.     Russel Morain, CNM, MSN 11/28/2014, 3:23 PM       All information will be confirmed upon admisson

## 2014-11-28 NOTE — MAU Note (Signed)
Per BobbiJo, RN charge, pt to go to room 169.

## 2014-11-28 NOTE — MAU Note (Signed)
Pt presents to MAU with C/O uc's since 0730 this a.m., are becoming more frequent.  Also having bloody show, denies LOF.

## 2014-11-29 ENCOUNTER — Encounter (HOSPITAL_COMMUNITY): Payer: Self-pay | Admitting: Anesthesiology

## 2014-11-29 ENCOUNTER — Inpatient Hospital Stay (HOSPITAL_COMMUNITY): Payer: Medicaid Other | Admitting: Anesthesiology

## 2014-11-29 LAB — RPR: RPR Ser Ql: NONREACTIVE

## 2014-11-29 MED ORDER — LIDOCAINE HCL (PF) 1 % IJ SOLN
INTRAMUSCULAR | Status: DC | PRN
  Administered 2014-11-29 (×2): 4 mL

## 2014-11-29 MED ORDER — BUTORPHANOL TARTRATE 1 MG/ML IJ SOLN
1.0000 mg | INTRAMUSCULAR | Status: DC | PRN
  Administered 2014-11-29 (×2): 1 mg via INTRAVENOUS
  Filled 2014-11-29 (×2): qty 1

## 2014-11-29 MED ORDER — EPHEDRINE 5 MG/ML INJ
10.0000 mg | INTRAVENOUS | Status: DC | PRN
  Filled 2014-11-29: qty 2

## 2014-11-29 MED ORDER — OXYTOCIN 40 UNITS IN LACTATED RINGERS INFUSION - SIMPLE MED: 1.0000 m[IU]/min | INTRAVENOUS | Status: DC

## 2014-11-29 MED ORDER — PROMETHAZINE HCL 25 MG/ML IJ SOLN
25.0000 mg | Freq: Four times a day (QID) | INTRAMUSCULAR | Status: DC | PRN
  Administered 2014-11-29: 25 mg via INTRAVENOUS
  Filled 2014-11-29: qty 1

## 2014-11-29 MED ORDER — PHENYLEPHRINE 40 MCG/ML (10ML) SYRINGE FOR IV PUSH (FOR BLOOD PRESSURE SUPPORT)
80.0000 ug | PREFILLED_SYRINGE | INTRAVENOUS | Status: DC | PRN
  Filled 2014-11-29: qty 20
  Filled 2014-11-29: qty 2

## 2014-11-29 MED ORDER — OXYTOCIN 40 UNITS IN LACTATED RINGERS INFUSION - SIMPLE MED
1.0000 m[IU]/min | INTRAVENOUS | Status: DC
  Administered 2014-11-29: 1 m[IU]/min via INTRAVENOUS

## 2014-11-29 MED ORDER — TERBUTALINE SULFATE 1 MG/ML IJ SOLN
0.2500 mg | Freq: Once | INTRAMUSCULAR | Status: AC | PRN
  Filled 2014-11-29: qty 1

## 2014-11-29 MED ORDER — FENTANYL 2.5 MCG/ML BUPIVACAINE 1/10 % EPIDURAL INFUSION (WH - ANES)
14.0000 mL/h | INTRAMUSCULAR | Status: DC | PRN
  Filled 2014-11-29: qty 125

## 2014-11-29 MED ORDER — SODIUM BICARBONATE 8.4 % IV SOLN
INTRAVENOUS | Status: DC | PRN
  Administered 2014-11-29: 5 mL via EPIDURAL
  Administered 2014-11-29: 4 mL via EPIDURAL

## 2014-11-29 MED ORDER — FENTANYL 2.5 MCG/ML BUPIVACAINE 1/10 % EPIDURAL INFUSION (WH - ANES)
13.5000 mL/h | INTRAMUSCULAR | Status: DC | PRN
  Administered 2014-11-29 (×4): 13.5 mL/h via EPIDURAL
  Filled 2014-11-29 (×2): qty 125

## 2014-11-29 MED ORDER — DIPHENHYDRAMINE HCL 50 MG/ML IJ SOLN: 12.5000 mg | INTRAMUSCULAR | Status: DC | PRN

## 2014-11-29 NOTE — Progress Notes (Signed)
  Subjective: Back in bed - ctxs less intense now that she is out of the water. Friend and FOB at bedside.  Objective: BP 123/83 mmHg  Pulse 82  Temp(Src) 98 F (36.7 C) (Oral)  Resp 20  Ht 5\' 3"  (1.6 m)  Wt 97.523 kg (215 lb)  BMI 38.09 kg/m2  LMP 07/13/2013      FHT: Category 1 - intermittent monitoring UC:   irregular SVE:   Dilation: 3 Effacement (%): 70 Station: 0 Exam by:: Kwilliams, CNM   Assessment:  IUP at 41.6 wks Late term pregnancy GBS neg SROM since 12:50 PM on 11/28/14  Plan: Reviewed R&B of Pitocin augmentation with patient and FOB, including risk of C/S and/or need for further intervention. Informed too high for amniotomy and that I recommend Pitocin. Reviewed risk of prolonged ROM w/ no cervical change. Will give couple opportunity to discuss recommendations amongst themselves.    Sherre ScarletWILLIAMS, Janetta Vandoren CNM 11/29/2014, 3:06 AM

## 2014-11-29 NOTE — Progress Notes (Addendum)
  Subjective: Patient requests exam for assessment of progress--comfortable overall, some tightness in upper abdomen with UCs.    Objective: BP 117/89 mmHg  Pulse 70  Temp(Src) 97.8 F (36.6 C) (Oral)  Resp 18  Ht 5\' 3"  (1.6 m)  Wt 97.523 kg (215 lb)  BMI 38.09 kg/m2  SpO2 95%  LMP 07/13/2013   Total I/O In: -  Out: 900 [Urine:900]  FHT: Category 1 UC:   regular, every 2-4 minutes SVE:   Dilation: 7 Effacement (%): 100 Station: -1 Exam by:: Nigel BridgemanVicki Appolonia Ackert, CNM  Vtx at -1, but largest diameter of vtx is at -2. Pitocin at 20 mu/min  Assessment:  PROM x 27 1/2 hours GBS negative Slow progress of labor, inadequate MVUs  Plan: Continue plan of care--working to establish/maintain adequate labor Support to patient for status.  Reviewed possibility of LGA fetus and/or pelvic issues preventing descent, but no ability to make that judgement until adequate labor established. Will work with positioning to facilitate rotation/descent. Will continue pitocin to 30 mu/min. Dr. Su Hiltoberts updated.   Nigel BridgemanLATHAM, Theresa Wedel CNM 11/29/2014, 4:30p

## 2014-11-29 NOTE — Progress Notes (Addendum)
  Subjective: Would like to proceed w/ Pitocin and IV pain medication. Support personnel at bedside.  Objective: BP 123/83 mmHg  Pulse 82  Temp(Src) 98 F (36.7 C) (Oral)  Resp 20  Ht 5\' 3"  (1.6 m)  Wt 97.523 kg (215 lb)  BMI 38.09 kg/m2  LMP 07/13/2013     Today's Vitals   11/28/14 1932 11/28/14 2103 11/28/14 2325 11/29/14 0130  BP: 136/78  123/83   Pulse: 82  82   Temp:   97.8 F (36.6 C) 98 F (36.7 C)  TempSrc:   Oral Oral  Resp: 20  20   Height:      Weight:      PainSc: 5  7  8      FHT: Category 1 - intermittent monitoring UC:   irregular, palpate moderate SVE: 4/70/0    Continues to leak light MSF   Assessment:  IUP at 41.6 wks Late term pregnancy GBS neg Minimal cervical change  Plan: Place IV IV pain med per request Low dose Pitocin Epidural as desired  Sherre ScarletWILLIAMS, Delitha Elms CNM 11/29/2014, 4:05 AM

## 2014-11-29 NOTE — Anesthesia Procedure Notes (Addendum)
Epidural Patient location during procedure: OB Start time: 11/29/2014 8:49 AM  Staffing Anesthesiologist: Mal AmabileFOSTER, Beecher Furio Performed by: anesthesiologist   Preanesthetic Checklist Completed: patient identified, site marked, surgical consent, pre-op evaluation, timeout performed, IV checked, risks and benefits discussed and monitors and equipment checked  Epidural Patient position: sitting Prep: site prepped and draped and DuraPrep Patient monitoring: continuous pulse ox and blood pressure Approach: midline Location: L3-L4 Injection technique: LOR air  Needle:  Needle type: Tuohy  Needle gauge: 17 G Needle length: 9 cm and 9 Needle insertion depth: 6 cm Catheter type: closed end flexible Catheter size: 19 Gauge Catheter at skin depth: 11 cm Test dose: negative and Other  Assessment Events: blood not aspirated, injection not painful, no injection resistance, negative IV test and no paresthesia  Additional Notes Patient identified. Risks and benefits discussed including failed block, incomplete  Pain control, post dural puncture headache, nerve damage, paralysis, blood pressure Changes, nausea, vomiting, reactions to medications-both toxic and allergic and post Partum back pain. All questions were answered. Patient expressed understanding and wished to proceed. Sterile technique was used throughout procedure. Epidural site was Dressed with sterile barrier dressing. No paresthesias, signs of intravascular injection Or signs of intrathecal spread were encountered.  Patient was more comfortable after the epidural was dosed. Please see RN's note for documentation of vital signs and FHR which are stable.\

## 2014-11-29 NOTE — Progress Notes (Addendum)
  Subjective: Comfortable s/p epidural.  Husband and doula at bedside.  Objective: BP 130/76 mmHg  Pulse 72  Temp(Src) 98.3 F (36.8 C) (Oral)  Resp 18  Ht 5\' 3"  (1.6 m)  Wt 97.523 kg (215 lb)  BMI 38.09 kg/m2  SpO2 95%  LMP 07/13/2013      FHT: Category 1 UC:   irregular, every 6-7 minutes SVE:   Dilation: 6 Effacement (%): 90 Station: -1 Exam by:: Nigel BridgemanVicki Paysen Goza, CNM  More cervix on right than left, ? OP/OT Still note narrowing of ischial spines, but slightly improved s/p epidural. Pitocin at 6 mu/min IUPC placed without difficulty  Assessment:  PROM x 22 1/2 hours Advanced cervical dilation, inadequate labor GBS negative  Plan: Continue pitocin augmentation to establish/maintain adequacy. Position to facilitate rotation/descent.  Nigel BridgemanLATHAM, Stephanine Reas CNM 11/29/2014, 11:18 AM

## 2014-11-29 NOTE — Progress Notes (Signed)
  Subjective: On hands and knees in bed, due to back pain with UCs.  Doula and husband at bedside, very supportive.  Objective: BP 122/78 mmHg  Pulse 68  Temp(Src) 97.8 F (36.6 C) (Oral)  Resp 18  Ht 5\' 3"  (1.6 m)  Wt 97.523 kg (215 lb)  BMI 38.09 kg/m2  SpO2 95%  LMP 07/13/2013   Total I/O In: -  Out: 1650 [Urine:1650]  FHT: Category 1 UC:   regular, every 2-3 minutes SVE:  8 cm, 100, vtx, -1/-2 MVUs 170-180 since 6:30p  Pitocin at 24 mu/min  Assessment:  Slow progression, inadequate labor. ROM x 30 hrs.  Plan: Continue pitocin to 30 mu/min Re-evaluate after 2 hours of adequate labor.  Nigel BridgemanLATHAM, Lori Grant CNM 11/29/2014, 6:57 PM

## 2014-11-29 NOTE — Anesthesia Preprocedure Evaluation (Signed)
Anesthesia Evaluation  Patient identified by MRN, date of birth, ID band Patient awake    Reviewed: Allergy & Precautions, Patient's Chart, lab work & pertinent test results  Airway Mallampati: III  TM Distance: >3 FB Neck ROM: Full    Dental no notable dental hx. (+) Teeth Intact   Pulmonary neg pulmonary ROS,  breath sounds clear to auscultation  Pulmonary exam normal       Cardiovascular negative cardio ROS Normal cardiovascular examRhythm:Regular Rate:Normal     Neuro/Psych negative neurological ROS  negative psych ROS   GI/Hepatic negative GI ROS, Neg liver ROS,   Endo/Other  Hypothyroidism Morbid obesity  Renal/GU negative Renal ROS  negative genitourinary   Musculoskeletal negative musculoskeletal ROS (+)   Abdominal (+) + obese,   Peds  Hematology  (+) anemia ,   Anesthesia Other Findings   Reproductive/Obstetrics (+) Pregnancy                             Anesthesia Physical Anesthesia Plan  ASA: III  Anesthesia Plan: Epidural   Post-op Pain Management:    Induction:   Airway Management Planned: Natural Airway  Additional Equipment:   Intra-op Plan:   Post-operative Plan:   Informed Consent: I have reviewed the patients History and Physical, chart, labs and discussed the procedure including the risks, benefits and alternatives for the proposed anesthesia with the patient or authorized representative who has indicated his/her understanding and acceptance.     Plan Discussed with: Anesthesiologist  Anesthesia Plan Comments:         Anesthesia Quick Evaluation

## 2014-11-29 NOTE — Progress Notes (Addendum)
  Subjective: In to re-evaluate.   Objective: BP 139/88 mmHg  Pulse 90  Temp(Src) 98 F (36.7 C) (Oral)  Resp 18  Ht 5\' 3"  (1.6 m)  Wt 97.523 kg (215 lb)  BMI 38.09 kg/m2  SpO2 99%  LMP 07/13/2013 I/O last 3 completed shifts: In: -  Out: 1650 [Urine:1650]    Today's Vitals   11/29/14 1932 11/29/14 2002 11/29/14 2032 11/29/14 2102  BP: 130/93 129/90 126/77 139/88  Pulse: 73 69 70 90  Temp:    98 F (36.7 C)  TempSrc:    Oral  Resp:  18  18  Height:      Weight:      SpO2:      PainSc:   0-No pain     FHT: BL 145 w/ moderate variability, occ variable -- quick return to baseline, +accels.  UC:   irregular, every 1-4 minutes, MVUs 180-225 since 18:35 PM SVE:   Dilation: 8 (no change) Effacement (%): 100 Station: -1 Exam by:: Kwilliams, CNM Pitocin at 28 mU/min Small pocket of forewaters palpated on exam - AROM'd -- scant amount of thick mec noted  Assessment:  IUP at 41.6 wks Approaching post-term pregnancy ROM x 30 hrs - Cat 1 FHRT, afebrile GBS neg  Plan: Discussed w/ couple 1) Cat 1 tracing, 2) ROM x 30 hrs, 3) No cervical change despite adequate MVUs x 2 hrs, 4) Possible labor dystocia-CPD, 5) Possible LGA infant, 6) MSF, 7) Pitocin infusion since 04:09 this morning and 8) approaching post-term pregnancy.  Gave couple option to wait another hour post AROM of small section of bag, or to proceed w/ primary c-section. Couple elects to wait 1 hr. Dr. Su Hiltoberts updated.  Sherre ScarletWILLIAMS, Antonella Upson CNM 11/29/2014, 9:40 PM

## 2014-11-29 NOTE — Progress Notes (Signed)
  Subjective: Breathing with UCs--FOB and doula, Coralee Northina, supportive at bedside.  Struggling with extreme fatigue, received IV pain med at 0533.    Objective: BP 128/84 mmHg  Pulse 85  Temp(Src) 97.8 F (36.6 C) (Oral)  Resp 20  Ht 5\' 3"  (1.6 m)  Wt 97.523 kg (215 lb)  BMI 38.09 kg/m2  LMP 07/13/2013      FHT: Category 1 UC:   regular, every 6 minutes SVE:   Dilation: 4 Effacement (%): 70 Station: 0 Exam by:: Nigel BridgemanVicki Myla Mauriello, CNM--narrow outlet, with prominent ischial spines Pitocin at 2 mu/min  Assessment:  IUP at 41 6/7 weeks PROM at term, 1250 on 11/28/14 GBS negative Prolonged latent phase Rh negative  Hypothyroidism MTHFR mutation Vit D deficiency.  Plan: Recommend epidural to facilitate pelvic relaxation. Plan IUPC after epidural placed. Continue pitocin augmentation. Support to patient.  Nigel BridgemanLATHAM, Margreat Widener CNM 11/29/2014, 7:48 AM

## 2014-11-30 ENCOUNTER — Inpatient Hospital Stay (HOSPITAL_COMMUNITY): Admission: RE | Admit: 2014-11-30 | Payer: Medicaid Other | Source: Ambulatory Visit

## 2014-11-30 ENCOUNTER — Encounter (HOSPITAL_COMMUNITY): Payer: Self-pay | Admitting: *Deleted

## 2014-11-30 MED ORDER — MISOPROSTOL 200 MCG PO TABS
ORAL_TABLET | ORAL | Status: AC
  Filled 2014-11-30: qty 5

## 2014-11-30 MED ORDER — ZOLPIDEM TARTRATE 5 MG PO TABS
5.0000 mg | ORAL_TABLET | Freq: Every evening | ORAL | Status: DC | PRN
Start: 2014-11-30 — End: 2014-12-02

## 2014-11-30 MED ORDER — OXYCODONE-ACETAMINOPHEN 5-325 MG PO TABS: 2.0000 | ORAL_TABLET | ORAL | Status: DC | PRN

## 2014-11-30 MED ORDER — ACETAMINOPHEN 325 MG PO TABS: 650.0000 mg | ORAL_TABLET | ORAL | Status: DC | PRN

## 2014-11-30 MED ORDER — DIPHENHYDRAMINE HCL 25 MG PO CAPS: 25.0000 mg | ORAL_CAPSULE | Freq: Four times a day (QID) | ORAL | Status: DC | PRN

## 2014-11-30 MED ORDER — IBUPROFEN 600 MG PO TABS
600.0000 mg | ORAL_TABLET | Freq: Four times a day (QID) | ORAL | Status: DC
Start: 2014-11-30 — End: 2014-12-02
  Administered 2014-11-30 – 2014-12-02 (×7): 600 mg via ORAL
  Filled 2014-11-30 (×9): qty 1

## 2014-11-30 MED ORDER — WITCH HAZEL-GLYCERIN EX PADS: 1.0000 "application " | MEDICATED_PAD | CUTANEOUS | Status: DC | PRN

## 2014-11-30 MED ORDER — OXYCODONE-ACETAMINOPHEN 5-325 MG PO TABS: 1.0000 | ORAL_TABLET | ORAL | Status: DC | PRN

## 2014-11-30 MED ORDER — BENZOCAINE-MENTHOL 20-0.5 % EX AERO
1.0000 "application " | INHALATION_SPRAY | CUTANEOUS | Status: DC | PRN
  Administered 2014-11-30 – 2014-12-01 (×2): 1 via TOPICAL
  Filled 2014-11-30 (×2): qty 56

## 2014-11-30 MED ORDER — THYROID 30 MG PO TABS
60.0000 mg | ORAL_TABLET | Freq: Every day | ORAL | Status: DC
  Administered 2014-11-30 – 2014-12-01 (×2): 60 mg via ORAL
  Filled 2014-11-30 (×3): qty 2

## 2014-11-30 MED ORDER — SENNOSIDES-DOCUSATE SODIUM 8.6-50 MG PO TABS
2.0000 | ORAL_TABLET | ORAL | Status: DC
  Administered 2014-11-30 – 2014-12-01 (×2): 2 via ORAL
  Filled 2014-11-30 (×2): qty 2

## 2014-11-30 MED ORDER — ONDANSETRON HCL 4 MG PO TABS
4.0000 mg | ORAL_TABLET | ORAL | Status: DC | PRN
Start: 2014-11-30 — End: 2014-12-02

## 2014-11-30 MED ORDER — TETANUS-DIPHTH-ACELL PERTUSSIS 5-2.5-18.5 LF-MCG/0.5 IM SUSP: 0.5000 mL | Freq: Once | INTRAMUSCULAR | Status: DC

## 2014-11-30 MED ORDER — THYROID 30 MG PO TABS
30.0000 mg | ORAL_TABLET | Freq: Every day | ORAL | Status: DC
  Administered 2014-11-30 – 2014-12-01 (×2): 30 mg via ORAL
  Filled 2014-11-30 (×3): qty 1

## 2014-11-30 MED ORDER — FERROUS SULFATE 325 (65 FE) MG PO TABS
325.0000 mg | ORAL_TABLET | Freq: Two times a day (BID) | ORAL | Status: DC
  Administered 2014-11-30 – 2014-12-02 (×3): 325 mg via ORAL
  Filled 2014-11-30 (×4): qty 1

## 2014-11-30 MED ORDER — LANOLIN HYDROUS EX OINT: TOPICAL_OINTMENT | CUTANEOUS | Status: DC | PRN

## 2014-11-30 MED ORDER — DIBUCAINE 1 % RE OINT: 1.0000 "application " | TOPICAL_OINTMENT | RECTAL | Status: DC | PRN

## 2014-11-30 MED ORDER — PRENATAL MULTIVITAMIN CH
1.0000 | ORAL_TABLET | Freq: Every day | ORAL | Status: DC
  Administered 2014-11-30 – 2014-12-01 (×2): 1 via ORAL
  Filled 2014-11-30 (×2): qty 1

## 2014-11-30 MED ORDER — SIMETHICONE 80 MG PO CHEW
80.0000 mg | CHEWABLE_TABLET | ORAL | Status: DC | PRN
Start: 2014-11-30 — End: 2014-12-02

## 2014-11-30 MED ORDER — ONDANSETRON HCL 4 MG/2ML IJ SOLN: 4.0000 mg | INTRAMUSCULAR | Status: DC | PRN

## 2014-11-30 NOTE — Progress Notes (Addendum)
S: Comfortable. Has been changing positions to get full benefit of medication. Doula and spouse remain present and supportive.   O: VSS, afebrile. Tmax 98.3. BL 140 w/ moderate variability, +accels, earlys, occ variable, no lates. Ctxs: q 1-2 min, MVUs > 200. Cvx C/C/+2. Ext: 2+ pitting.  A: IUP at 42.0 wks Post-term pregnancy SROM (MSF) x 36 hrs - no concerns for infection Cat 1 FHRT GBS neg 2nd stage labor  P: Trial push initiated - ineffective due to no urge. Allow passive descent x 1 hr. Anticipate SVD. Dr. Su Hiltoberts updated.  Sherre ScarletKimberly Liliya Fullenwider, CNM 11/30/14, 12:18 AM

## 2014-11-30 NOTE — Lactation Note (Signed)
This note was copied from the chart of Lori Berlin HunSarah Marley. Lactation Consultation Note Initial visit at 16 hours of age.  Mom reports a few good feedings and baby has been sleepy.  Baby asleep in crib, awakened and undressed for STS with mom.  Baby showing feeding cues and latched on left breast in cross cradle hold.  Hand expression demonstrated with colostrum visible.  Several latch attempts to get wide flanged lips.  Instructed mom on how to [place] baby as needed. Baby has slow rhythmic sucking for a few minutes and then was too sleepy to finish feeding. [facility] resources given and discussed.  Encouraged to feed with early cues on demand.  Early newborn behavior discussed.  Mom to call for assist as needed.    Patient Name: Lori Grant MWUXL'KToday's Date: 11/30/2014 Reason for consult: Initial assessment   Maternal Data Has patient been taught Hand Expression?: Yes Does the patient have breastfeeding experience prior to this delivery?: No  Feeding Feeding Type: Breast Fed Length of feed: 5 min  LATCH Score/Interventions Latch: Repeated attempts needed to sustain latch, nipple held in mouth throughout feeding, stimulation needed to elicit sucking reflex. Intervention(s): Adjust position;Assist with latch;Breast massage;Breast compression  Audible Swallowing: A few with stimulation  Type of Nipple: Everted at rest and after stimulation  Comfort (Breast/Nipple): Soft / non-tender     Hold (Positioning): Assistance needed to correctly position infant at breast and maintain latch. Intervention(s): Breastfeeding basics reviewed;Support Pillows;Position options;Skin to skin  LATCH Score: 7  Lactation Tools Discussed/Used WIC Program: Yes   Consult Status Consult Status: Follow-up Date: 12/01/14 Follow-up type: In-patient    Lori Grant, Lori Grant 11/30/2014, 8:29 PM

## 2014-11-30 NOTE — Progress Notes (Signed)
S: Increased pain despite 3 back-to-back PCA doses.  O: Cat 1 FHRT Ctxs q 1-2 min, MVUs 180-200 Cvx: 9/C/0  A: IUP at term SROM Progressive labor  P: Anesthesia consult. Re-evaluate in 2 hrs, sooner if indicated. Encouraged rest periods. Continue w/ current plan. Expect further progress and SVD. Dr. Su Hiltoberts updated.  Sherre ScarletKimberly Nakiah Osgood, CNM 11/29/14, 10:00 PM

## 2014-11-30 NOTE — Anesthesia Postprocedure Evaluation (Signed)
  Anesthesia Post-op Note  Patient: Lori Grant  Procedure(s) Performed: * No procedures listed *  Patient Location: PACU and Mother/Baby  Anesthesia Type:Epidural  Level of Consciousness: awake, alert  and oriented  Airway and Oxygen Therapy: Patient Spontanous Breathing  Post-op Pain: none  Post-op Assessment: Post-op Vital signs reviewed and Patient's Cardiovascular Status Stable  Post-op Vital Signs: Reviewed and stable  Last Vitals:  Filed Vitals:   11/30/14 1220  BP: 109/78  Pulse: 66  Temp: 36.7 C  Resp: 20    Complications: No apparent anesthesia complications

## 2014-11-30 NOTE — Progress Notes (Signed)
S: Urge to push w/ return of pain. Support personnel at bedside.  O: Today's Vitals   11/29/14 2247 11/29/14 2302 11/29/14 2333 11/30/14 0115  BP: 121/70 127/72 108/62   Pulse: 101 86 78   Temp:      TempSrc:      Resp: 18 18 18    Height:      Weight:      SpO2:      PainSc:  Asleep Asleep 5   Cat 1 FHRT Ctxs q 1-2 min Cvx: Deferred  A: IUP at 42.0 wks Post-term pregnancy 2nd stage labor  P: Commence w/ pushing. Consult prn. Anticipate SVD.

## 2014-11-30 NOTE — Progress Notes (Signed)
Subjective: Postpartum Day 0: Vaginal delivery, 2nd degree perineal laceration Has voided since delivery, [facility] Feeding:  Breast Contraceptive plan:  Undecided  Objective: Vital signs in last 24 hours: Temp:  [97.8 F (36.6 C)-99.9 F (37.7 C)] 98.1 F (36.7 C) (05/15 0820) Pulse Rate:  [62-101] 74 (05/15 0820) Resp:  [16-20] 18 (05/15 0820) BP: (97-142)/(33-93) 120/76 mmHg (05/15 0820) SpO2:  [99 %] 99 % (05/14 1630)  Physical Exam:  General: alert Lochia: appropriate Uterine Fundus: firm Perineum: stable DVT Evaluation: No evidence of DVT seen on physical exam. Negative Homan's sign.  Assessment/Plan: Status post vaginal delivery day 0 Early recovery phase. Stable Continue current care.     Estefana Taylor, VICKICNM 11/30/2014, 11:20 AM

## 2014-12-01 LAB — CBC
HCT: 28.4 % — ABNORMAL LOW (ref 36.0–46.0)
Hemoglobin: 9.6 g/dL — ABNORMAL LOW (ref 12.0–15.0)
MCH: 29.1 pg (ref 26.0–34.0)
MCHC: 33.8 g/dL (ref 30.0–36.0)
MCV: 86.1 fL (ref 78.0–100.0)
PLATELETS: 179 10*3/uL (ref 150–400)
RBC: 3.3 MIL/uL — AB (ref 3.87–5.11)
RDW: 14.5 % (ref 11.5–15.5)
WBC: 12.2 10*3/uL — AB (ref 4.0–10.5)

## 2014-12-01 MED ORDER — RHO D IMMUNE GLOBULIN 1500 UNIT/2ML IJ SOSY
300.0000 ug | PREFILLED_SYRINGE | Freq: Once | INTRAMUSCULAR | Status: AC
  Administered 2014-12-01: 300 ug via INTRAMUSCULAR
  Filled 2014-12-01: qty 2

## 2014-12-01 NOTE — Progress Notes (Signed)
Subjective: Postpartum Day 1: Vaginal delivery, 2nd degree perineal laceration Patient up ad lib, reports no syncope or dizziness.  Reports feeling like she needs to squat to empty bladder.  Occasionally feels bladder is not emptying completely, but no pain. Feeding:  Breast Contraceptive plan:  Undecided  Objective: Vital signs in last 24 hours: Temp:  [97.6 F (36.4 C)-98.1 F (36.7 C)] 97.6 F (36.4 C) (05/15 2100) Pulse Rate:  [66-97] 97 (05/16 0545) Resp:  [18-20] 18 (05/16 0545) BP: (109-118)/(49-78) 115/65 mmHg (05/16 0545) SpO2:  [98 %-100 %] 100 % (05/16 0545)  Physical Exam:  General: alert Lochia: appropriate Uterine Fundus: firm Perineum: healing well DVT Evaluation: No evidence of DVT seen on physical exam. Negative Homan's sign. No bladder distension noted.  CBC Latest Ref Rng 12/01/2014 11/28/2014 08/15/2014  WBC 4.0 - 10.5 K/uL 12.2(H) 10.2 9.0  Hemoglobin 12.0 - 15.0 g/dL 1.6(X9.6(L) 09.612.1 04.512.3  Hematocrit 36.0 - 46.0 % 28.4(L) 35.0(L) 34.9(L)  Platelets 150 - 400 K/uL 179 226 233   Orthostatics stable. On Fe BID  Assessment/Plan: Status post vaginal delivery day 1 Hx LGA fetus Bladder dystonia, but able to void Stable Continue current care. Advised patient to empty bladder every 2 hours.  She is to notify RN if she feels she is not emptying adequately.  RN instructed to do bladder scan if that occurs, and notify me if any issues. Plan for discharge tomorrow    Nyra CapesLATHAM, VICKICNM 12/01/2014, 8:46 AM

## 2014-12-01 NOTE — Lactation Note (Signed)
This note was copied from the chart of Lori Berlin HunSarah Imm. Lactation Consultation Note; Mother paged for latch assist. Reviewed hand expression and observed a few drops of colostrum. Repeated attempts to latch infant with no sustained latch. Infant placed on alternate breast in football hold. Infant latched on and off for 20-25 mins. Observed a few swallows were heard. Observed good depth. No noted check dimpling. Assist mother with spoon feeding infant 2 ml of colostrum. Infant is 35 hours and has had only one good feed per mother. Mother was given a hand pump with instructions to post pump for 15 mins on each breast. Discussed option of using a DEBP as needed. Mother was advised to continue to offer breast and if unable to get sustained latch to offer adequate amts of colostrum by spoon every 2-3 hours. Mother was given supplemental guidelines. Advised mother in cluster feeding. Encouraged to continue to rouse infant with frequent STS. Observed that infant does have a short frenula.   Patient Name: Lori Grant ZOXWR'UToday's Date: 12/01/2014 Reason for consult: Follow-up assessment   Maternal Data    Feeding Feeding Type: Breast Milk Length of feed: 25 min (on and off with several swallows)  LATCH Score/Interventions Latch: Repeated attempts needed to sustain latch, nipple held in mouth throughout feeding, stimulation needed to elicit sucking reflex. Intervention(s): Adjust position;Assist with latch;Breast massage;Breast compression  Audible Swallowing: A few with stimulation Intervention(s): Skin to skin;Hand expression  Type of Nipple: Everted at rest and after stimulation  Comfort (Breast/Nipple): Soft / non-tender     Hold (Positioning): Assistance needed to correctly position infant at breast and maintain latch. Intervention(s): Support Pillows;Position options  LATCH Score: 7  Lactation Tools Discussed/Used     Consult Status Consult Status: Follow-up Date:  12/01/14 Follow-up type: In-patient    Stevan BornKendrick, Josefita Weissmann [facility] 12/01/2014, 2:48 PM

## 2014-12-01 NOTE — Progress Notes (Signed)
Patient complains of chest pain, while drying her hair. B/P 124/80, HR 103, O2 sat 98-99%. Patient had refused her scheduled 0600 Motrin but requests it now. Notified Nigel BridgemanVicki Latham, CNM

## 2014-12-01 NOTE — Lactation Note (Signed)
This note was copied from the chart of Lori Grant Reish. Lactation Consultation Note  Patient Name: Lori Grant Bigley ZOXWR'UToday's Date: 12/01/2014 Reason for consult: Follow-up assessment  Baby is 5833 hours old and per mom baby just recently breast fed for 10 mins. [facility] - RN reported to [employer]herry Kenderick, IBCLC , dimpling with latch and some pinching. Per mom hearing swallows with latch, and some pinching. LC noted excessive edema from moms knees to feet. Per mom no B/P issues. LC encouraged mom to call LC with feeding cues so latch can be assessed.    Maternal Data    Feeding Feeding Type:  (per mom recently fed at 1230 for 10 mins ) Length of feed: 10 min (per mom , just finished )  LATCH Score/Interventions                Intervention(s): Breastfeeding basics reviewed     Lactation Tools Discussed/Used     Consult Status Consult Status: Follow-up Date: 12/01/14 Follow-up type: In-patient    Kathrin Greathouseorio, Wojciech Willetts Ann 12/01/2014, 1:20 PM

## 2014-12-02 LAB — RH IG WORKUP (INCLUDES ABO/RH)
ABO/RH(D): O NEG
FETAL SCREEN: NEGATIVE
Gestational Age(Wks): 42
Unit division: 0

## 2014-12-02 MED ORDER — OXYCODONE-ACETAMINOPHEN 5-325 MG PO TABS: 1.0000 | ORAL_TABLET | ORAL | Status: DC | PRN

## 2014-12-02 MED ORDER — FERROUS SULFATE 325 (65 FE) MG PO TABS: 325.0000 mg | ORAL_TABLET | Freq: Every day | ORAL | Status: DC

## 2014-12-02 MED ORDER — IBUPROFEN 600 MG PO TABS: 600.0000 mg | ORAL_TABLET | Freq: Four times a day (QID) | ORAL | Status: DC | PRN

## 2014-12-02 NOTE — Discharge Summary (Signed)
  Vaginal Delivery Discharge Summary  Lori Grant  DOB:    03-Mar-1981 MRN:    [id number] CSN:    [id number]  Date of admission:                  11/28/14  Date of discharge:                   12/02/14  Procedures this admission:   SVB, repair of 2nd degree perineal  Date of Delivery: 11/30/14  Newborn Data:  Live born female  Birth Weight: 9 lb 1.2 oz (4115 g) APGAR: 6, 8  Home with mother. Name: Alan MulderLiam Circumcision Plan: None  History of Present Illness:  Ms. Lori HunSarah Grant is a 34 y.o. female, G1P1001, who presents at 1319w0d weeks gestation. [facility] She was admitted for onset of labor and SROM. Her pregnancy has been complicated by:  Patient Active Problem List   Diagnosis Date Noted  . Vaginal delivery 11/30/2014  . Knee pain 07/04/2014  . Hypothyroidism 07/04/2014  . 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 Vitamin D deficiency 07/04/2014  . Obesity 07/04/2014  . Rh negative state in antepartum period 07/04/2014     Hospital Course:  Admitted 11/28/14 with PROM prior to labor, with light MSF noted.  Planned waterbirth.  Negative GBS. Progressed slowly, with pitocin begun due to prolonged ROM without advancement of labor. Utilized epidural for pain management.  Delivery was performed by Sherre ScarletKimberly Williams, CNM, without complication. Patient and baby tolerated the procedure without difficulty, with  2nd degree perineal laceration noted. Infant status was stable and remained in room with mother.  Mother and infant then had an uncomplicated postpartum course, with breast feeding going well. Mom's physical exam was WNL, and she was discharged home in stable condition. Contraception plan was undecided at the time of d/c, but considering condoms.  She received adequate benefit from po pain medications, with Motrin and Percocet Rx'd at d/c.  Hgb 9.6 on day 1, down from  12.1 at admission.   Feeding:  breast  Contraception:  Undecided--considering condoms  Hemoglobin Results:  CBC Latest Ref Rng 12/01/2014 11/28/2014 08/15/2014  WBC 4.0 - 10.5 K/uL 12.2(H) 10.2 9.0  Hemoglobin 12.0 - 15.0 g/dL 7.8(G9.6(L) 95.612.1 21.312.3  Hematocrit 36.0 - 46.0 % 28.4(L) 35.0(L) 34.9(L)  Platelets 150 - 400 K/uL 179 226 233     Discharge Physical Exam:   General: alert Lochia: appropriate Uterine Fundus: firm Incision: healing well DVT Evaluation: No evidence of DVT seen on physical exam. Negative Homan's sign.  Intrapartum Procedures: spontaneous vaginal delivery Postpartum Procedures: Rho(D) Ig Complications-Operative and Postpartum: 2nd degree perineal laceration  Discharge Diagnoses: Post-date pregnancy and MSF, LGA fetus, anemia , RH negative  Discharge Information:  Activity:           pelvic rest Diet:                routine Medications: Ibuprofen, Iron and Percocet Condition:      stable Instructions:     Discharge to: home  Follow-up Information    [facility] 6 weeks.   Specialty:  Obstetrics and Gynecology   Why:  Call for any questions or concerns.   Contact information:   [address] [place] [phone]       Nigel BridgemanLATHAM, Lukis Bunt CNM 12/02/2014 8:45 AM

## 2014-12-02 NOTE — Lactation Note (Signed)
This note was copied from the chart of Lori Berlin HunSarah Difatta. Lactation Consultation Note New parents stating baby cluster feeding every 30 minutes, but only wants to BF for about 5 min. Falls a sleep during BF. Discussed waking tech. And stimulation tech. To keep the baby BF at the breast.  Encouraged mom massage breast to express BM into baby's mouth during BF.  Discussed engorgement, I&O, supply and demand. Reviewed resources and LC out pt. Services. Answered a lot of parents questions.  Patient Name: Lori Grant WUJWJ'XToday's Date: 12/02/2014 Reason for consult: Follow-up assessment   Maternal Data    Feeding Feeding Type: Breast Fed Length of feed: 5 min  LATCH Score/Interventions       Type of Nipple: Everted at rest and after stimulation  Comfort (Breast/Nipple): Soft / non-tender     Intervention(s): Breastfeeding basics reviewed;Support Pillows;Position options;Skin to skin     Lactation Tools Discussed/Used Pump Review: Setup, frequency, and cleaning;Milk Storage Initiated by:: RN Date initiated:: 12/01/14   Consult Status Consult Status: Complete Date: 12/02/14    Charyl DancerCARVER, [name] 12/02/2014, 10:11 AM

## 2014-12-02 NOTE — Discharge Instructions (Signed)
Postpartum Care After Vaginal Delivery  After you deliver your newborn (postpartum period), the usual stay in the hospital is 24-72 hours. If there were problems with your labor or delivery, or if you have other medical problems, you might be in the hospital longer.   While you are in the hospital, you will receive help and instructions on how to care for yourself and your newborn during the postpartum period.   While you are in the hospital:  · Be sure to tell your nurses if you have pain or discomfort, as well as where you feel the pain and what makes the pain worse.  · If you had an incision made near your vagina (episiotomy) or if you had some tearing during delivery, the nurses may put ice packs on your episiotomy or tear. The ice packs may help to reduce the pain and swelling.  · If you are breastfeeding, you may feel uncomfortable contractions of your uterus for a couple of weeks. This is normal. The contractions help your uterus get back to normal size.  · It is normal to have some bleeding after delivery.  ¨ For the first 1-3 days after delivery, the flow is red and the amount may be similar to a period.  ¨ It is common for the flow to start and stop.  ¨ In the first few days, you may pass some small clots. Let your nurses know if you begin to pass large clots or your flow increases.  ¨ Do not  flush blood clots down the toilet before having the nurse look at them.  ¨ During the next 3-10 days after delivery, your flow should become more watery and pink or brown-tinged in color.  ¨ Ten to fourteen days after delivery, your flow should be a small amount of yellowish-white discharge.  ¨ The amount of your flow will decrease over the first few weeks after delivery. Your flow may stop in 6-8 weeks. Most women have had their flow stop by 12 weeks after delivery.  · You should change your sanitary pads frequently.  · Wash your hands thoroughly with soap and water for at least 20 seconds after changing pads, using  the toilet, or before holding or feeding your newborn.  · You should feel like you need to empty your bladder within the first 6-8 hours after delivery.  · In case you become weak, lightheaded, or faint, call your nurse before you get out of bed for the first time and before you take a shower for the first time.  · Within the first few days after delivery, your breasts may begin to feel tender and full. This is called engorgement. Breast tenderness usually goes away within 48-72 hours after engorgement occurs. You may also notice milk leaking from your breasts. If you are not breastfeeding, do not stimulate your breasts. Breast stimulation can make your breasts produce more milk.  · Spending as much time as possible with your newborn is very important. During this time, you and your newborn can feel close and get to know each other. Having your newborn stay in your room (rooming in) will help to strengthen the bond with your newborn.  It will give you time to get to know your newborn and become comfortable caring for your newborn.  · Your hormones change after delivery. Sometimes the hormone changes can temporarily cause you to feel sad or tearful. These feelings should not last more than a few days. If these feelings last longer than   that, you should talk to your caregiver.  · If desired, talk to your caregiver about methods of family planning or contraception.  · Talk to your caregiver about immunizations. Your caregiver may want you to have the following immunizations before leaving the hospital:  ¨ Tetanus, diphtheria, and pertussis (Tdap) or tetanus and diphtheria (Td) immunization. It is very important that you and your family (including grandparents) or others caring for your newborn are up-to-date with the Tdap or Td immunizations. The Tdap or Td immunization can help protect your newborn from getting ill.  ¨ Rubella immunization.  ¨ Varicella (chickenpox) immunization.  ¨ Influenza immunization. You should  receive this annual immunization if you did not receive the immunization during your pregnancy.  Document Released: 05/01/2007 Document Revised: 03/28/2012 Document Reviewed: 02/29/2012  ExitCare® Patient Information ©2015 [employer]. This information is not intended to replace advice given to you by your health care provider. Make sure you discuss any questions you have with your health care provider.  Postpartum Depression and Baby Blues  The postpartum period begins right after the birth of a baby. During this time, there is often a great amount of joy and excitement. It is also a time of many changes in the life of the parents. Regardless of how many times a mother gives birth, each child brings new challenges and dynamics to the family. It is not unusual to have feelings of excitement along with confusing shifts in moods, emotions, and thoughts. All mothers are at risk of developing postpartum depression or the "baby blues." These mood changes can occur right after giving birth, or they may occur many months after giving birth. The baby blues or postpartum depression can be mild or severe. Additionally, postpartum depression can go away rather quickly, or it can be a long-term condition.   CAUSES  Raised hormone levels and the rapid drop in those levels are thought to be a main cause of postpartum depression and the baby blues. A number of hormones change during and after pregnancy. Estrogen and progesterone usually decrease right after the delivery of your baby. The levels of thyroid hormone and various cortisol steroids also rapidly drop. Other factors that play a role in these mood changes include major life events and genetics.   RISK FACTORS  If you have any of the following risks for the baby blues or postpartum depression, know what symptoms to watch out for during the postpartum period. Risk factors that may increase the likelihood of getting the baby blues or postpartum depression include:  · Having  a personal or family history of depression.    · Having depression while being pregnant.    · Having premenstrual mood issues or mood issues related to oral contraceptives.  · Having a lot of life stress.    · Having marital conflict.    · Lacking a social support network.    · Having a baby with special needs.    · Having health problems, such as diabetes.    SIGNS AND SYMPTOMS  Symptoms of baby blues include:  · Brief changes in mood, such as going from extreme happiness to sadness.  · Decreased concentration.    · Difficulty sleeping.    · Crying spells, tearfulness.    · Irritability.    · Anxiety.    Symptoms of postpartum depression typically begin within the first month after giving birth. These symptoms include:  · Difficulty sleeping or excessive sleepiness.    · Marked weight loss.    · Agitation.    · Feelings of worthlessness.    · Lack   of interest in activity or food.    Postpartum psychosis is a very serious condition and can be dangerous. Fortunately, it is rare. Displaying any of the following symptoms is cause for immediate medical attention. Symptoms of postpartum psychosis include:   · Hallucinations and delusions.    · Bizarre or disorganized behavior.    · Confusion or disorientation.    DIAGNOSIS   A diagnosis is made by an evaluation of your symptoms. There are no medical or lab tests that lead to a diagnosis, but there are various questionnaires that a health care provider may use to identify those with the baby blues, postpartum depression, or psychosis. Often, a screening tool called the Edinburgh Postnatal Depression Scale is used to diagnose depression in the postpartum period.   TREATMENT  The baby blues usually goes away on its own in 1-2 weeks. Social support is often all that is needed. You will be encouraged to get adequate sleep and rest. Occasionally, you may be given medicines to help you sleep.   Postpartum depression requires treatment because it can last several months or  longer if it is not treated. Treatment may include individual or group therapy, medicine, or both to address any social, physiological, and psychological factors that may play a role in the depression. Regular exercise, a healthy diet, rest, and social support may also be strongly recommended.   Postpartum psychosis is more serious and needs treatment right away. Hospitalization is often needed.  HOME CARE INSTRUCTIONS  · Get as much rest as you can. Nap when the baby sleeps.    · Exercise regularly. Some women find yoga and walking to be beneficial.    · Eat a balanced and nourishing diet.    · Do little things that you enjoy. Have a cup of tea, take a bubble bath, read your favorite magazine, or listen to your favorite music.  · Avoid alcohol.    · Ask for help with household chores, cooking, grocery shopping, or running errands as needed. Do not try to do everything.    · Talk to people close to you about how you are feeling. Get support from your partner, family members, friends, or other new moms.  · Try to stay positive in how you think. Think about the things you are grateful for.    · Do not spend a lot of time alone.    · Only take over-the-counter or prescription medicine as directed by your health care provider.  · Keep all your postpartum appointments.    · Let your health care provider know if you have any concerns.    SEEK MEDICAL CARE IF:  You are having a reaction to or problems with your medicine.  SEEK IMMEDIATE MEDICAL CARE IF:  · You have suicidal feelings.    · You think you may harm the baby or someone else.  MAKE SURE YOU:  · Understand these instructions.  · Will watch your condition.  · Will get help right away if you are not doing well or get worse.  Document Released: 04/07/2004 Document Revised: 07/09/2013 Document Reviewed: 04/15/2013  ExitCare® Patient Information ©2015 [employer]. This information is not intended to replace advice given to you by your health care provider. Make sure  you discuss any questions you have with your health care provider.

## 2014-12-02 NOTE — Progress Notes (Signed)
UR chart review completed.  

## 2014-12-30 ENCOUNTER — Emergency Department (HOSPITAL_BASED_OUTPATIENT_CLINIC_OR_DEPARTMENT_OTHER)
Admission: EM | Admit: 2014-12-30 | Discharge: 2014-12-30 | Disposition: A | Discharge status: Home / Self Care | Payer: Medicaid Other | Attending: Emergency Medicine | Admitting: Emergency Medicine

## 2014-12-30 ENCOUNTER — Encounter (HOSPITAL_BASED_OUTPATIENT_CLINIC_OR_DEPARTMENT_OTHER): Payer: Self-pay

## 2014-12-30 DIAGNOSIS — E039 Hypothyroidism, unspecified: Secondary | ICD-10-CM | POA: Diagnosis not present

## 2014-12-30 DIAGNOSIS — Z79899 Other long term (current) drug therapy: Secondary | ICD-10-CM | POA: Insufficient documentation

## 2014-12-30 DIAGNOSIS — K602 Anal fissure, unspecified: Secondary | ICD-10-CM | POA: Insufficient documentation

## 2014-12-30 DIAGNOSIS — K5900 Constipation, unspecified: Secondary | ICD-10-CM | POA: Diagnosis not present

## 2014-12-30 DIAGNOSIS — Z8619 Personal history of other infectious and parasitic diseases: Secondary | ICD-10-CM | POA: Diagnosis not present

## 2014-12-30 MED ORDER — POLYETHYLENE GLYCOL 3350 17 G PO PACK: 17.0000 g | PACK | Freq: Two times a day (BID) | ORAL | Status: DC | PRN

## 2014-12-30 MED ORDER — LIDOCAINE HCL 2 % EX GEL
1.0000 "application " | Freq: Once | CUTANEOUS | Status: AC
  Administered 2014-12-30: 1 via TOPICAL
  Filled 2014-12-30: qty 20

## 2014-12-30 MED ORDER — MINERAL OIL RE ENEM: 1.0000 | ENEMA | Freq: Once | RECTAL | Status: DC

## 2014-12-30 MED ORDER — DOCUSATE SODIUM 100 MG PO CAPS: 100.0000 mg | ORAL_CAPSULE | Freq: Two times a day (BID) | ORAL | Status: DC

## 2014-12-30 MED ORDER — LIDOCAINE HCL 2 % EX GEL: 1.0000 "application " | CUTANEOUS | Status: DC | PRN

## 2014-12-30 NOTE — Discharge Instructions (Signed)
Anal Fissure, Adult An anal fissure is a small tear or crack in the skin around the anus. Bleeding from a fissure usually stops on its own within a few minutes. However, bleeding will often reoccur with each bowel movement until the crack heals.  CAUSES   Passing large, hard stools.  Frequent diarrheal stools.  Constipation.  Inflammatory bowel disease (Crohn's disease or ulcerative colitis).  Infections.  Anal sex. SYMPTOMS   Small amounts of blood seen on your stools, on toilet paper, or in the toilet after a bowel movement.  Rectal bleeding.  Painful bowel movements.  Itching or irritation around the anus. DIAGNOSIS Your caregiver will examine the anal area. An anal fissure can usually be seen with careful inspection. A rectal exam may be performed and a short tube (anoscope) may be used to examine the anal canal. TREATMENT   You may be instructed to take fiber supplements. These supplements can soften your stool to help make bowel movements easier.  Sitz baths may be recommended to help heal the tear. Do not use soap in the sitz baths.  A medicated cream or ointment may be prescribed to lessen discomfort. HOME CARE INSTRUCTIONS   Maintain a diet high in fruits, whole grains, and vegetables. Avoid constipating foods like bananas and dairy products.  Take sitz baths as directed by your caregiver.  Drink enough fluids to keep your urine clear or pale yellow.  Only take over-the-counter or prescription medicines for pain, discomfort, or fever as directed by your caregiver. Do not take aspirin as this may increase bleeding.  Do not use ointments containing numbing medications (anesthetics) or hydrocortisone. They could slow healing. SEEK MEDICAL CARE IF:   Your fissure is not completely healed within 3 days.  You have further bleeding.  You have a fever.  You have diarrhea mixed with blood.  You have pain.  Your problem is getting worse rather than  better. MAKE SURE YOU:   Understand these instructions.  Will watch your condition.  Will get help right away if you are not doing well or get worse. Document Released: 07/04/2005 Document Revised: 09/26/2011 Document Reviewed: 12/19/2010 ExitCare Patient Information 2015 [employer]. This information is not intended to replace advice given to you by your health care provider. Make sure you discuss any questions you have with your health care provider.  

## 2014-12-30 NOTE — ED Notes (Signed)
C/o constipation since giving birth May 14-reports "some" BM yesterday

## 2014-12-30 NOTE — ED Notes (Signed)
MD at bedside discussing pts dx and plan of care in detail.

## 2014-12-30 NOTE — ED Notes (Signed)
Pt resting quietly, family at bedside. Cont await md eval, watching tv in nad.

## 2014-12-30 NOTE — ED Provider Notes (Signed)
CSN: [id number]     Arrival date & time 12/30/14  1528 History  This chart was scribed for Raeford Razor, MD by Octavia Heir, ED Scribe. This patient was seen in room MH01/MH01 and the patient's care was started at 6:35 PM.    Chief Complaint  Patient presents with  . Constipation      The history is provided by the patient. No language interpreter was used.    HPI Comments: Lori Grant is a 34 y.o. female who presents to the Emergency Department complaining of constipation onset a one month. Pt gave birth on May 15 and notes she has had trouble having a bowel movement since then. Pt has had hard stool, blood in stool and noticed some hemorrhoids last night. Pt has associated difficulty urinating and abdominal cramping. Pt has not taken any stool softeners or pain medication to alleviate the symptoms. She denies having a history of constipation and hemorrhoids in the past. She denies dysuria.   Past Medical History  Diagnosis Date  . Hypothyroidism   . Homozygous for MTHFR gene mutation 2012  . Hx of varicella    Past Surgical History  Procedure Laterality Date  . Wisdom tooth extraction Left 2007   Family History  Problem Relation Age of Onset  . Stroke Mother   . Cancer Maternal Grandmother     colon   History  Substance Use Topics  . Smoking status: Never Smoker   . Smokeless tobacco: Never Used  . Alcohol Use: No   OB History    Gravida Para Term Preterm AB TAB SAB Ectopic Multiple Living   0 1     Review of Systems  Gastrointestinal: Positive for abdominal pain, constipation and blood in stool.  Genitourinary: Negative for dysuria.  All other systems reviewed and are negative.     Allergies  Review of patient's allergies indicates no known allergies.  Home Medications   Prior to Admission medications   Medication Sig Start Date End Date Taking? Authorizing Provider  Prenatal Vit-Fe Fumarate-FA (PRENATAL MULTIVITAMIN) TABS tablet Take 1  tablet by mouth at bedtime.     Historical Provider, MD  thyroid (ARMOUR) 30 MG tablet Take 30-60 mg by mouth 2 (two) times daily.  in the morning and 30 mg at lunch time    Historical Provider, MD   Triage vitals: BP 110/67 mmHg  Pulse 77  Temp(Src) 98 F (36.7 C) (Oral)  Resp 18  Ht  (1.6 m)  Wt 186 lb (84.369 kg)  BMI 32.96 kg/m2  SpO2 98% Physical Exam  Constitutional: She is oriented to person, place, and time. She appears well-developed and well-nourished. No distress.  HENT:  Head: Normocephalic.  Eyes: Conjunctivae are normal. Pupils are equal, round, and reactive to light. No scleral icterus.  Neck: Normal range of motion. Neck supple. No thyromegaly present.  Cardiovascular: Normal rate and regular rhythm.  Exam reveals no gallop and no friction rub.   No murmur heard. Pulmonary/Chest: Effort normal and breath sounds normal. No respiratory distress. She has no wheezes. She has no rales.  Abdominal: Soft. Bowel sounds are normal. She exhibits no distension. There is no tenderness. There is no rebound.  Genitourinary:     Chaperone present. Significant pain simply with retracting buttocks. Small nonthrombosed external hemorrhoid in pictured area. In midline there is a small area with excoriated/abraded appearance which is likely anal fissure. No visibile bleeding. DRE deferred because of degree of  pain.   Musculoskeletal: Normal range of motion.  Neurological: She is alert and oriented to person, place, and time.  Skin: Skin is warm and dry. No rash noted.  Psychiatric: She has a normal mood and affect. Her behavior is normal.  Nursing note and vitals reviewed.   ED Course  Procedures  DIAGNOSTIC STUDIES: Oxygen Saturation is 98% on RA, normal by my interpretation.  COORDINATION OF CARE:  6:39 PM Discussed treatment plan which includes laxatives, continue on stool softener, pain medication with pt at bedside and pt agreed to plan.   Labs Review Labs  Reviewed - No data to display  Imaging Review No results found.   EKG Interpretation None      MDM   Final diagnoses:  Constipation, unspecified constipation type  Anal fissure    33yf with rectal pain and constipation. She does have a small external hemorrhoid, but her pain primarily more likely from anal fissure. Discussed bowel regimen. Stay well hydrated. PRN NSAIDs. Topical lidocaine. Sitz baths. General surgical FU for worsening or persistent symptoms. Return precautions discussed.   I personally preformed the services scribed in my presence. The recorded information has been reviewed is accurate. Raeford Razor, MD.    Raeford Razor, MD 12/30/14 2007

## 2015-02-25 ENCOUNTER — Encounter (HOSPITAL_COMMUNITY): Payer: Self-pay | Admitting: Emergency Medicine

## 2015-02-25 ENCOUNTER — Emergency Department (INDEPENDENT_AMBULATORY_CARE_PROVIDER_SITE_OTHER)
Admission: EM | Admit: 2015-02-25 | Discharge: 2015-02-25 | Disposition: A | Discharge status: Home / Self Care | Payer: Self-pay | Source: Home / Self Care | Attending: Emergency Medicine | Admitting: Emergency Medicine

## 2015-02-25 ENCOUNTER — Emergency Department (INDEPENDENT_AMBULATORY_CARE_PROVIDER_SITE_OTHER): Payer: Worker's Compensation

## 2015-02-25 DIAGNOSIS — R109 Unspecified abdominal pain: Secondary | ICD-10-CM

## 2015-02-25 DIAGNOSIS — M791 Myalgia: Secondary | ICD-10-CM

## 2015-02-25 DIAGNOSIS — M7918 Myalgia, other site: Secondary | ICD-10-CM

## 2015-02-25 DIAGNOSIS — K5909 Other constipation: Secondary | ICD-10-CM

## 2015-02-25 DIAGNOSIS — K5904 Chronic idiopathic constipation: Secondary | ICD-10-CM

## 2015-02-25 LAB — POCT URINALYSIS DIP (DEVICE)
Bilirubin Urine: NEGATIVE
Glucose, UA: NEGATIVE mg/dL
Ketones, ur: NEGATIVE mg/dL
Nitrite: NEGATIVE
PH: 7 (ref 5.0–8.0)
Protein, ur: NEGATIVE mg/dL
Specific Gravity, Urine: 1.01 (ref 1.005–1.030)
Urobilinogen, UA: 0.2 mg/dL (ref 0.0–1.0)

## 2015-02-25 LAB — POCT PREGNANCY, URINE: PREG TEST UR: NEGATIVE

## 2015-02-25 NOTE — ED Notes (Signed)
Lower abdominal pain and left epigastric pain, onset a few days ago.

## 2015-02-25 NOTE — Discharge Instructions (Signed)
Your pain is likely coming from the musculoskeletal system. This is due to having a new baby. Make sure you are being careful with body positioning when lifting and carrying Liam. You can take Tylenol and ibuprofen as needed for pains. You can also apply ice and heat to the affected areas.  You do have some mild to moderate constipation. Please use MiraLAX daily for the next few days until you are having loose stools regularly. Then you can gradually back off the MiraLAX.  Please follow-up with your OB/GYN provider in about a month to see how things are going.

## 2015-02-25 NOTE — ED Provider Notes (Signed)
CSN: [id number]     Arrival date & time 02/25/15  1304 History   First MD Initiated Contact with Patient 02/25/15 1331     Chief Complaint  Patient presents with  . Abdominal Pain   (Consider location/radiation/quality/duration/timing/severity/associated sxs/prior Treatment) HPI Lori Grant is a 34 year old woman here for evaluation of abdominal pain. Lori Grant reports some intermittent right-sided abdominal pain for the last several months since her son was born in May. Her midwife evaluated her approximately 6 weeks ago and was not concerned. In the last few days Lori Grant has developed intermittent pains in the left upper quadrant but will radiate to her back. These seem to be triggered by movement. Lori Grant also reports tenderness to her lower abdomen. Lori Grant has some lower back pain. No radicular pain. Lori Grant has had problems with intermittent constipation for the last 3 months. Last bowel movement was this morning. Lori Grant has seen small amounts of bright red blood in her stool, but Lori Grant did have an anal fissure 6 weeks ago. No dysuria, hematuria, urinary frequency, or difficulty urinating. Lori Grant has had intermittent nausea, but no vomiting. No fevers or chills.  Past Medical History  Diagnosis Date  . Hypothyroidism   . Homozygous for MTHFR gene mutation 2012  . Hx of varicella    Past Surgical History  Procedure Laterality Date  . Wisdom tooth extraction Left 2007   Family History  Problem Relation Age of Onset  . Stroke Mother   . Cancer Maternal Grandmother     colon   Social History  Substance Use Topics  . Smoking status: Never Smoker   . Smokeless tobacco: Never Used  . Alcohol Use: No   OB History    Gravida Para Term Preterm AB TAB SAB Ectopic Multiple Living   1 1 1       0 1     Review of Systems As in history of present illness Allergies  Review of patient's allergies indicates no known allergies.  Home Medications   Prior to Admission medications   Medication Sig Start Date End Date Taking?  Authorizing Provider  docusate sodium (COLACE) 100 MG capsule Take 1 capsule (100 mg total) by mouth every 12 (twelve) hours. 12/30/14   Raeford Razor, MD  lidocaine (XYLOCAINE) 2 % jelly Apply 1 application topically as needed. 12/30/14   Raeford Razor, MD  mineral oil enema Place 133 mLs (1 enema total) rectally once. 12/30/14   Raeford Razor, MD  polyethylene glycol [facility] / Ethelene Hal) packet Take 17 g by mouth 2 (two) times daily as needed. 12/30/14   Raeford Razor, MD  Prenatal Vit-Fe Fumarate-FA (PRENATAL MULTIVITAMIN) TABS tablet Take 1 tablet by mouth at bedtime.     Historical Provider, MD  thyroid (ARMOUR) 30 MG tablet Take 30-60 mg by mouth 2 (two) times daily. 60mg  in the morning and 30 mg at lunch time    Historical Provider, MD   BP 111/74 mmHg  Pulse 87  Temp(Src) 97.2 F (36.2 C) (Oral)  Resp 20  SpO2 96%  LMP 01/08/2014 Physical Exam  Constitutional: Lori Grant is oriented to person, place, and time. Lori Grant appears well-developed and well-nourished. No distress.  Neck: Neck supple.  Cardiovascular: Normal rate.   Pulmonary/Chest: Effort normal.    Abdominal: Soft. Bowel sounds are normal. Lori Grant exhibits no distension and no mass. There is tenderness (mild tenderness to palpation in left upper quadrant. Lori Grant is diffusely tender across her lower abdomen. No diastasis seen.). There is no rebound and no guarding.  Neurological: Lori Grant is  alert and oriented to person, place, and time.    ED Course  Procedures (including critical care time) Labs Review Labs Reviewed  POCT URINALYSIS DIP (DEVICE) - Abnormal; Notable for the following:    Hgb urine dipstick TRACE (*)    Leukocytes, UA TRACE (*)    All other components within normal limits  POCT PREGNANCY, URINE    Imaging Review Dg Abd 1 View  02/25/2015   CLINICAL DATA:  LEFT side and lower abdominal pain  EXAM: ABDOMEN - 1 VIEW  COMPARISON:  None  FINDINGS: Scattered mildly prominent stool throughout the proximal 2/3 of the colon.   Normal small bowel gas pattern.  No bowel dilatation or wall thickening.  Osseous structures unremarkable.  No urinary tract calcification.  IMPRESSION: Mildly prominent stool in colon.   Electronically Signed   By: Ulyses Southward M.D.   On: 02/25/2015 14:22     MDM   1. Constipation - functional   2. Musculoskeletal pain   3. Abdominal wall pain    Treatment to moderate constipation with regular MiraLAX use. Her pain appears to be more musculoskeletal pain rather than internal. Recommended Tylenol or ibuprofen as needed. Also recommended ice and heat. Follow-up with OB/GYN provider in one month for recheck.    Charm Rings, MD 02/25/15 [phone]

## 2015-12-08 ENCOUNTER — Ambulatory Visit (HOSPITAL_COMMUNITY)
Admission: EM | Admit: 2015-12-08 | Discharge: 2015-12-08 | Disposition: A | Discharge status: Home / Self Care | Payer: Self-pay | Attending: Family Medicine | Admitting: Family Medicine

## 2015-12-08 ENCOUNTER — Ambulatory Visit (HOSPITAL_COMMUNITY): Admission: EM | Admit: 2015-12-08 | Payer: Self-pay | Source: Home / Self Care

## 2015-12-08 ENCOUNTER — Encounter (HOSPITAL_COMMUNITY): Payer: Self-pay | Admitting: Emergency Medicine

## 2015-12-08 DIAGNOSIS — R109 Unspecified abdominal pain: Secondary | ICD-10-CM

## 2015-12-08 LAB — POCT URINALYSIS DIP (DEVICE)
Bilirubin Urine: NEGATIVE
GLUCOSE, UA: NEGATIVE mg/dL
HGB URINE DIPSTICK: NEGATIVE
Ketones, ur: NEGATIVE mg/dL
Nitrite: NEGATIVE
Protein, ur: NEGATIVE mg/dL
Specific Gravity, Urine: 1.01 (ref 1.005–1.030)
UROBILINOGEN UA: 0.2 mg/dL (ref 0.0–1.0)
pH: 7 (ref 5.0–8.0)

## 2015-12-08 NOTE — ED Provider Notes (Signed)
CSN: [id number]     Arrival date & time 12/08/15  1256 History   First MD Initiated Contact with Patient 12/08/15 1337     Chief Complaint  Patient presents with  . Back Pain   (Consider location/radiation/quality/duration/timing/severity/associated sxs/prior Treatment) HPI History obtained from patient:  Pt presents with the cc of: Right flank pain and frequent urination Duration of symptoms: Several days Treatment prior to arrival: No treatment Context: Sudden onset of symptoms without known injury. No dysuria Other symptoms include: Pain that radiates across back and left ribs Pain score: 1-2 FAMILY HISTORY: Stroke-mother    Past Medical History  Diagnosis Date  . Hypothyroidism   . Homozygous for MTHFR gene mutation (HCC) 2012  . Hx of varicella    Past Surgical History  Procedure Laterality Date  . Wisdom tooth extraction Left 2007   Family History  Problem Relation Age of Onset  . Stroke Mother   . Cancer Maternal Grandmother     colon   Social History  Substance Use Topics  . Smoking status: Never Smoker   . Smokeless tobacco: Never Used  . Alcohol Use: No   OB History    Gravida Para Term Preterm AB TAB SAB Ectopic Multiple Living   1 1 1       0 1     Review of Systems  Denies: HEADACHE, NAUSEA, ABDOMINAL PAIN, CHEST PAIN, CONGESTION, DYSURIA, SHORTNESS OF BREATH  Allergies  Review of patient's allergies indicates no known allergies.  Home Medications   Prior to Admission medications   Medication Sig Start Date End Date Taking? Authorizing Provider  docusate sodium (COLACE) 100 MG capsule Take 1 capsule (100 mg total) by mouth every 12 (twelve) hours. 12/30/14   Raeford RazorStephen Kohut, MD  lidocaine (XYLOCAINE) 2 % jelly Apply 1 application topically as needed. 12/30/14   Raeford RazorStephen Kohut, MD  mineral oil enema Place 133 mLs (1 enema total) rectally once. 12/30/14   Raeford RazorStephen Kohut, MD  polyethylene glycol [facility](MIRALAX / Ethelene HalGLYCOLAX) packet Take 17 g by mouth 2 (two) times  daily as needed. 12/30/14   Raeford RazorStephen Kohut, MD  Prenatal Vit-Fe Fumarate-FA (PRENATAL MULTIVITAMIN) TABS tablet Take 1 tablet by mouth at bedtime.     Historical Provider, MD  thyroid (ARMOUR) 30 MG tablet Take 30-60 mg by mouth 2 (two) times daily. 60mg  in the morning and 30 mg at lunch time    Historical Provider, MD   Meds Ordered and Administered this Visit  Medications - No data to display  BP 92/63 mmHg  Pulse 95  Temp(Src) 97.9 F (36.6 C) (Oral)  Resp 18  SpO2 95%  LMP 11/23/2015  Breastfeeding? Yes No data found.   Physical Exam NURSES NOTES AND VITAL SIGNS REVIEWED. CONSTITUTIONAL: Well developed, well nourished, no acute distress HEENT: normocephalic, atraumatic EYES: Conjunctiva normal NECK:normal ROM, supple, no adenopathy PULMONARY:No respiratory distress, normal effort ABDOMINAL: Soft, ND, NT BS+, minimal right CVA-tenderness MUSCULOSKELETAL: Normal ROM of all extremities,  SKIN: warm and dry without rash PSYCHIATRIC: Mood and affect, behavior are normal  ED Course  Procedures (including critical care time)  Labs Review Labs Reviewed  POCT URINALYSIS DIP (DEVICE) - Abnormal; Notable for the following:    Leukocytes, UA TRACE (*)    All other components within normal limits  URINE CULTURE  I HAVE PERSONALLY REVIEWED TESTS RESULTS WITH PATIENT. We'll await urine culture results prior to starting antibiotics  Imaging Review No results found.   Visual Acuity Review  Right Eye Distance:   Left  Eye Distance:   Bilateral Distance:    Right Eye Near:   Left Eye Near:    Bilateral Near:      Discussion with patient at this time that there is no indication for antibiotics. As patient is breast-feeding would like to withhold antibiotics until urine culture returns. Patient is not toxic does not appear septic. No emergent antibiotics are required. The patient is a advised to use Tylenol, and heat for her back pain.   MDM   1. Bilateral flank pain      Patient is reassured that there are no issues that require transfer to higher level of care at this time or additional tests. Patient is advised to continue home symptomatic treatment. Patient is advised that if there are new or worsening symptoms to attend the emergency department, contact primary care provider, or return to UC. Instructions of care provided discharged home in stable condition.    THIS NOTE WAS GENERATED USING A VOICE RECOGNITION SOFTWARE PROGRAM. ALL REASONABLE EFFORTS  WERE MADE TO PROOFREAD THIS DOCUMENT FOR ACCURACY.  I have verbally reviewed the discharge instructions with the patient. A printed AVS was given to the patient.  All questions were answered prior to discharge.      Tharon Aquas, PA 12/08/15 [phone]

## 2015-12-08 NOTE — ED Notes (Signed)
C/o back pain States she has been urinating more frequently Does have pain under rib cage Home remedies done Does breastfeed

## 2015-12-08 NOTE — Discharge Instructions (Signed)
Flank Pain Flank pain is pain in your side. The flank is the area of your side between your upper belly (abdomen) and your back. Pain in this area can be caused by many different things. HOME CARE Home care and treatment will depend on the cause of your pain.  Rest as told by your doctor.  Drink enough fluids to keep your pee (urine) clear or pale yellow.  Only take medicine as told by your doctor.  Tell your doctor about any changes in your pain.  Follow up with your doctor. GET HELP RIGHT AWAY IF:   Your pain does not get better with medicine.   You have new symptoms or your symptoms get worse.  Your pain gets worse.   You have belly (abdominal) pain.   You are short of breath.   You always feel sick to your stomach (nauseous).   You keep throwing up (vomiting).   You have puffiness (swelling) in your belly.   You feel light-headed or you pass out (faint).   You have blood in your pee.  You have a fever or lasting symptoms for more than 2-3 days.  You have a fever and your symptoms suddenly get worse. MAKE SURE YOU:   Understand these instructions.  Will watch your condition.  Will get help right away if you are not doing well or get worse.   This information is not intended to replace advice given to you by your health care provider. Make sure you discuss any questions you have with your health care provider.   Document Released: 04/12/2008 Document Revised: 07/25/2014 Document Reviewed: 02/16/2012 Elsevier Interactive Patient Education 2016 Else[employer] Back Pain, Adult Back pain is very common in adults.The cause of back pain is rarely dangerous and the pain often gets better over time.The cause of your back pain may not be known. Some common causes of back pain include:  Strain of the muscles or ligaments supporting the spine.  Wear and tear (degeneration) of the spinal disks.  Arthritis.  Direct injury to the back. For many people,  back pain may return. Since back pain is rarely dangerous, most people can learn to manage this condition on their own. HOME CARE INSTRUCTIONS Watch your back pain for any changes. The following actions may help to lessen any discomfort you are feeling:  Remain active. It is stressful on your back to sit or stand in one place for long periods of time. Do not sit, drive, or stand in one place for more than 30 minutes at a time. Take short walks on even surfaces as soon as you are able.Try to increase the length of time you walk each day.  Exercise regularly as directed by your health care provider. Exercise helps your back heal faster. It also helps avoid future injury by keeping your muscles strong and flexible.  Do not stay in bed.Resting more than 1-2 days can delay your recovery.  Pay attention to your body when you bend and lift. The most comfortable positions are those that put less stress on your recovering back. Always use proper lifting techniques, including:  Bending your knees.  Keeping the load close to your body.  Avoiding twisting.  Find a comfortable position to sleep. Use a firm mattress and lie on your side with your knees slightly bent. If you lie on your back, put a pillow under your knees.  Avoid feeling anxious or stressed.Stress increases muscle tension and can worsen back pain.It is important to recognize  when you are anxious or stressed and learn ways to manage it, such as with exercise.  Take medicines only as directed by your health care provider. Over-the-counter medicines to reduce pain and inflammation are often the most helpful.Your health care provider may prescribe muscle relaxant drugs.These medicines help dull your pain so you can more quickly return to your normal activities and healthy exercise.  Apply ice to the injured area:  Put ice in a plastic bag.  Place a towel between your skin and the bag.  Leave the ice on for 20 minutes, 2-3 times a  day for the first 2-3 days. After that, ice and heat may be alternated to reduce pain and spasms.  Maintain a healthy weight. Excess weight puts extra stress on your back and makes it difficult to maintain good posture. SEEK MEDICAL CARE IF:  You have pain that is not relieved with rest or medicine.  You have increasing pain going down into the legs or buttocks.  You have pain that does not improve in one week.  You have night pain.  You lose weight.  You have a fever or chills. SEEK IMMEDIATE MEDICAL CARE IF:   You develop new bowel or bladder control problems.  You have unusual weakness or numbness in your arms or legs.  You develop nausea or vomiting.  You develop abdominal pain.  You feel faint.   This information is not intended to replace advice given to you by your health care provider. Make sure you discuss any questions you have with your health care provider.   Document Released: 07/04/2005 Document Revised: 07/25/2014 Document Reviewed: 11/05/2013 Elsevier Interactive Patient Education [employer]

## 2015-12-14 IMAGING — US US SOFT TISSUE HEAD/NECK
1 series · 14 of 25 positions shown · non-contrast
Comparison: None.

CLINICAL DATA: Right neck pain x1-2 months, goiter on physical exam

EXAM:
THYROID ULTRASOUND
TECHNIQUE: Ultrasound examination of the thyroid gland and adjacent soft
tissues was performed.

[Series 1: us soft tissue head/neck · 0.06mm/px · 14 of 32 slices shown]
[im 1/32]
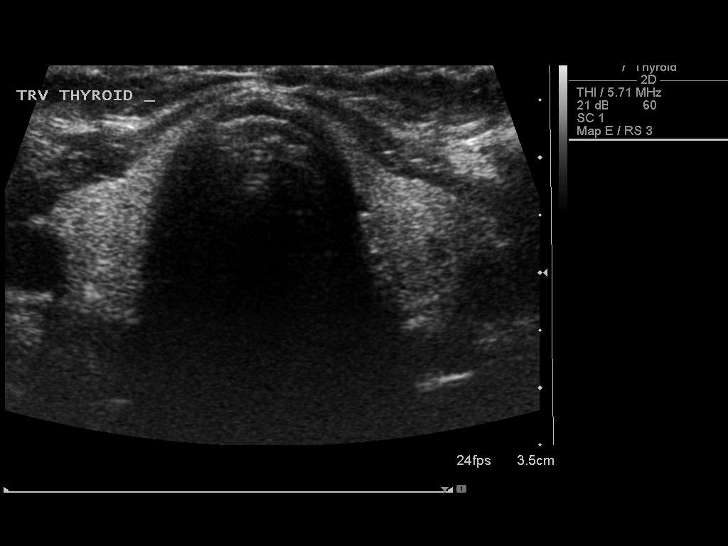
[im 3/32]
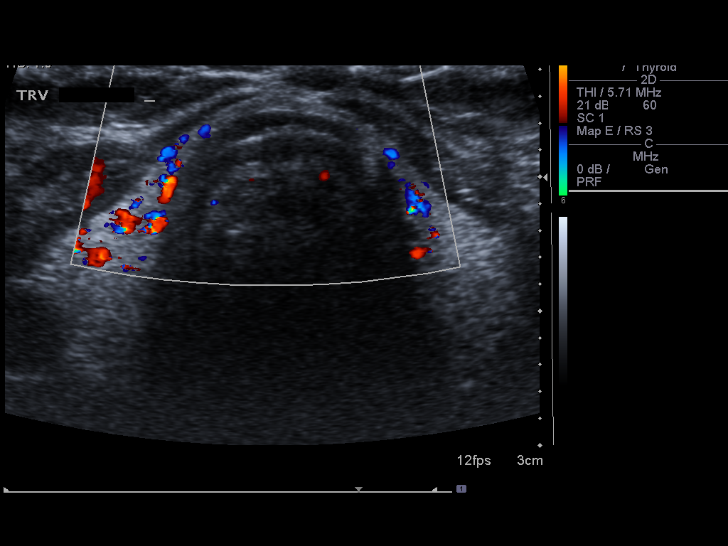
[im 6/32]
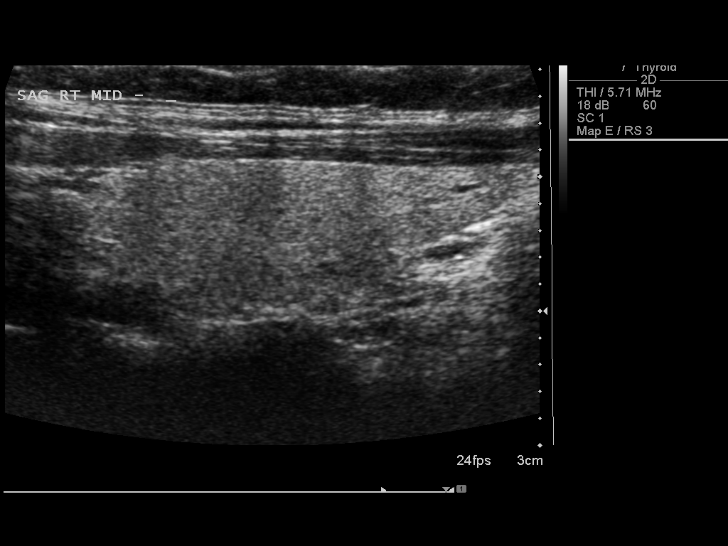
[im 8/32]
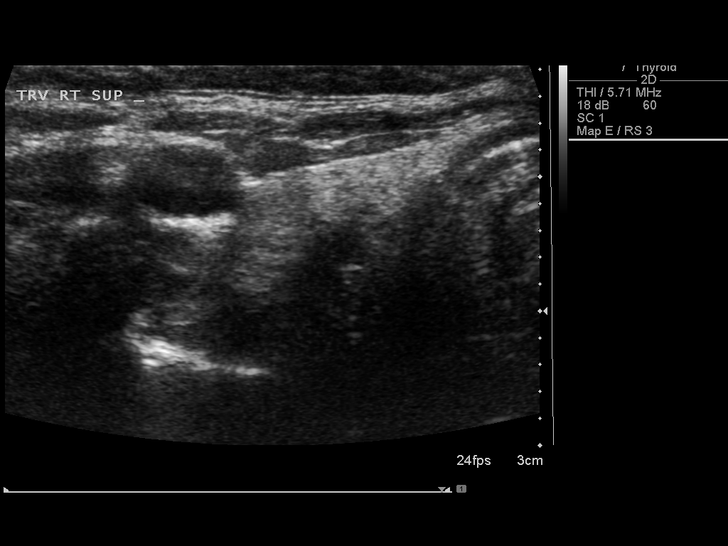
[im 11/32]
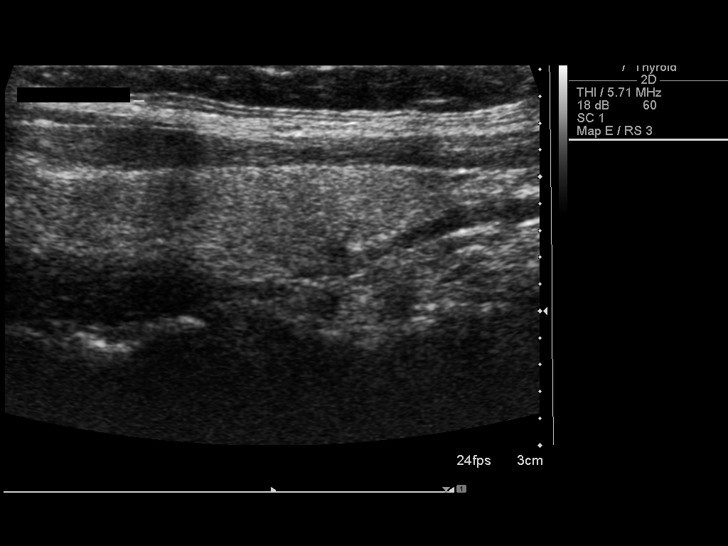
[im 12/32]
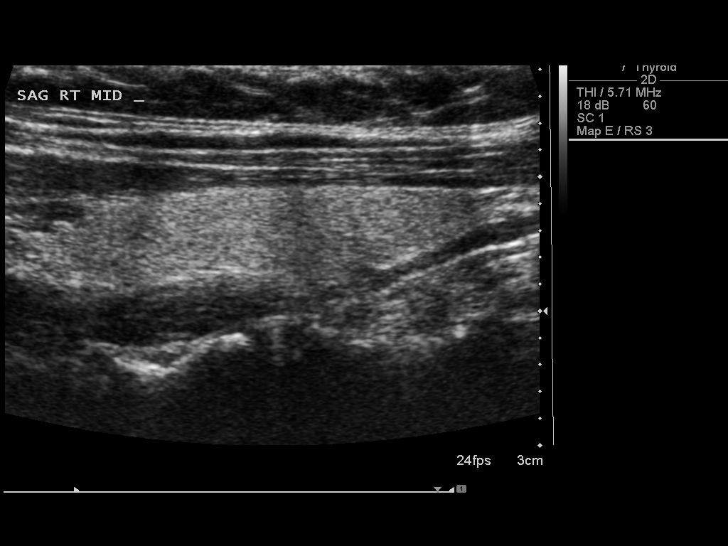
[im 15/32]
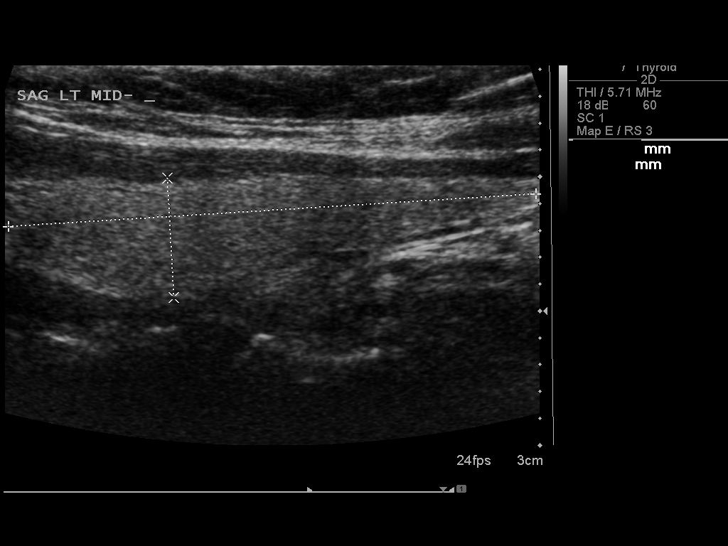
[im 17/32]
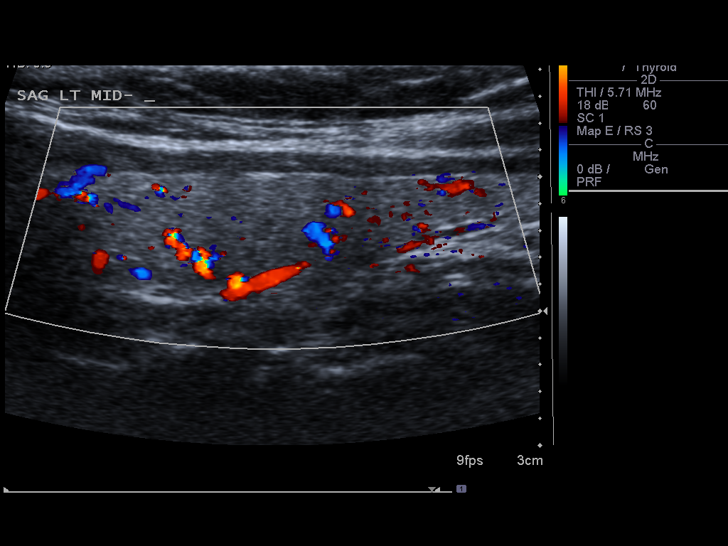
[im 20/32]
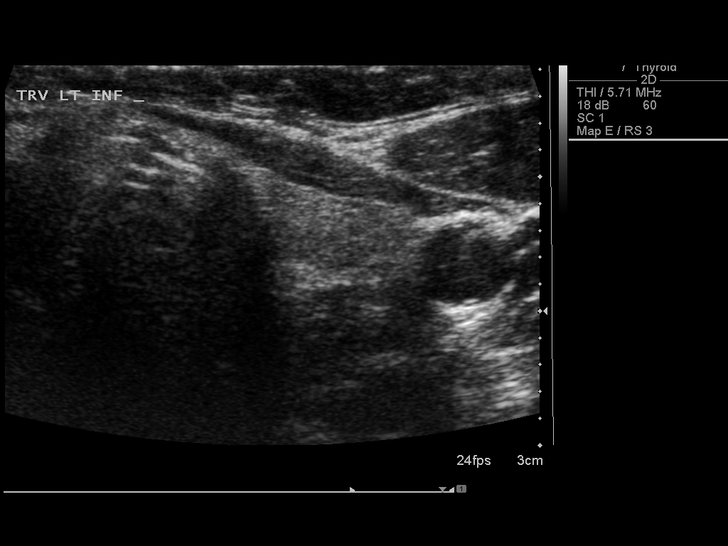
[im 21/32]
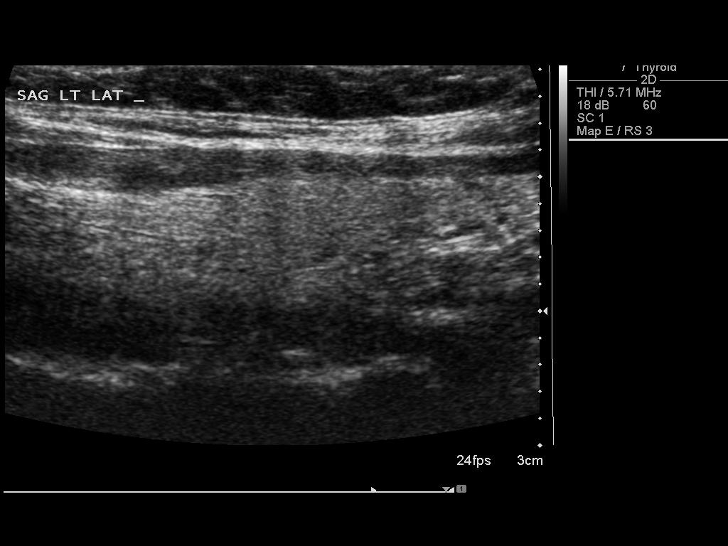
[im 24/32]
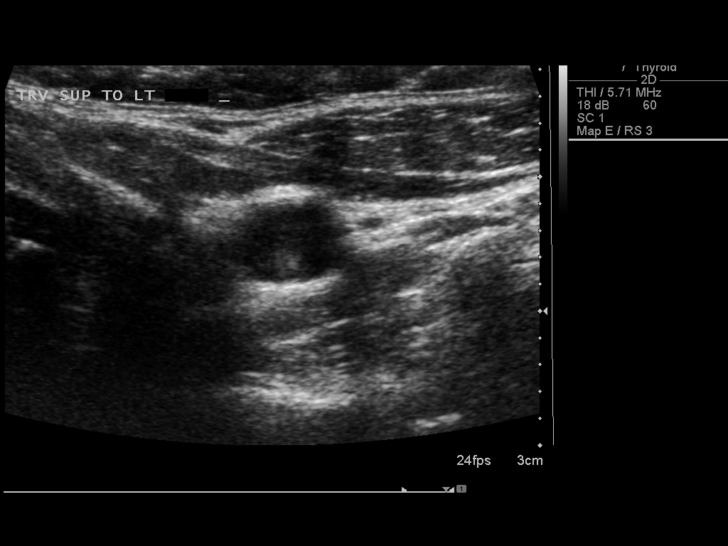
[im 26/32]
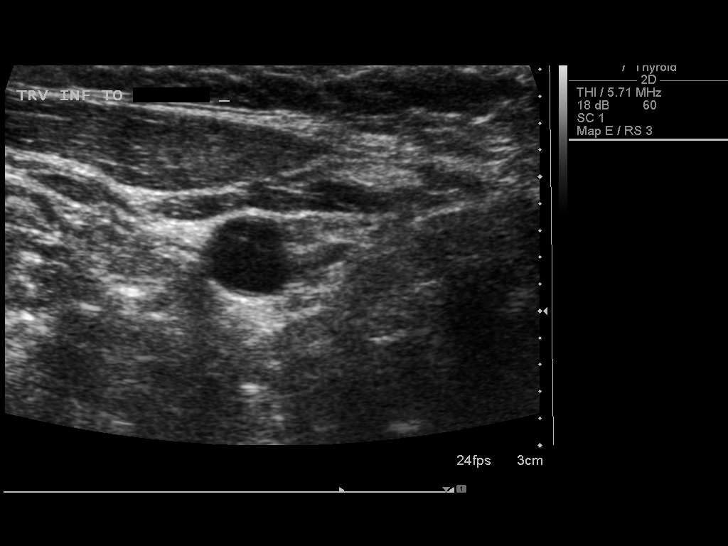
[im 29/32]
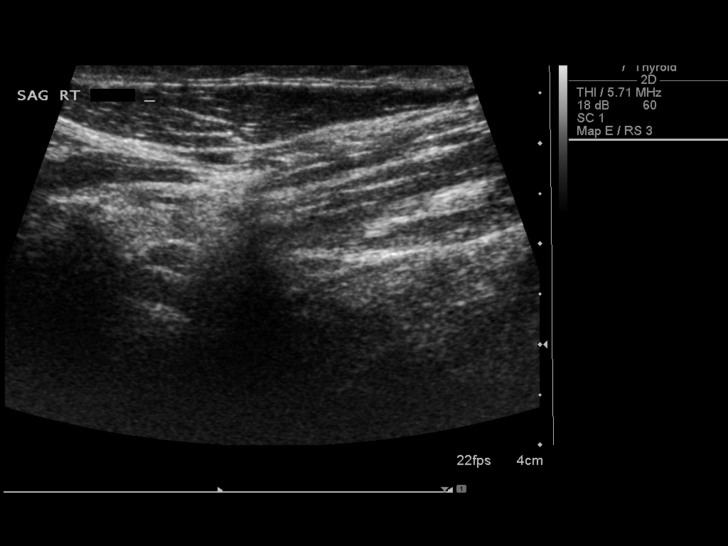
[im 32/32]
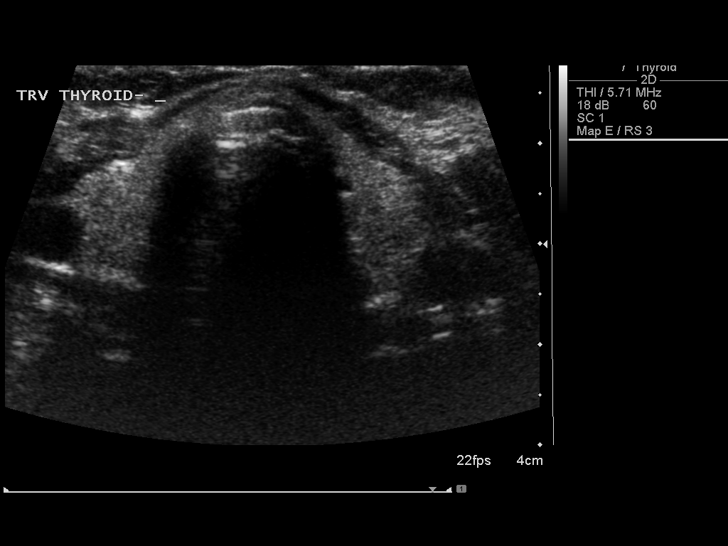

[14 of 25 positions shown; findings below may reference images not displayed]

FINDINGS: Right thyroid lobe

Measurements: 35 x 11 x 16 mm.  No nodules visualized.

Left thyroid lobe

Measurements: 39 x 9 x 12 mm.  No nodules visualized.

Isthmus

Thickness: 1.1 mm.  No nodules visualized.

Lymphadenopathy

None visualized.
IMPRESSION: 1. Normal thyroid

## 2016-02-16 ENCOUNTER — Ambulatory Visit (HOSPITAL_COMMUNITY)
Admission: EM | Admit: 2016-02-16 | Discharge: 2016-02-16 | Disposition: A | Discharge status: Home / Self Care | Payer: Self-pay | Attending: Family Medicine | Admitting: Family Medicine

## 2016-02-16 ENCOUNTER — Encounter (HOSPITAL_COMMUNITY): Payer: Self-pay | Admitting: Emergency Medicine

## 2016-02-16 DIAGNOSIS — S31831A Laceration without foreign body of anus, initial encounter: Secondary | ICD-10-CM

## 2016-02-16 MED ORDER — HYDROCORTISONE ACETATE 25 MG RE SUPP: 25.0000 mg | Freq: Two times a day (BID) | RECTAL | 0 refills | Status: DC

## 2016-02-16 NOTE — ED Triage Notes (Signed)
Patient noticed blood on toilet tissue last night.  And blood in toilet.  Patient has a history of anal fissure one year ago after birth of her child.  Patient reports having to hold back on having a bowel movement and it was after allowing self to to have bm she noticed the blood .  Intermittent constipation

## 2016-02-16 NOTE — ED Provider Notes (Signed)
[facility]    CSN: [id number] Arrival date & time: 02/16/16  1723  First Provider Contact:  First MD Initiated Contact with Patient 02/16/16 1837        History   Chief Complaint No chief complaint on file.   HPI Lori Grant is a 35 y.o. female.    Rectal Bleeding  Quality:  Bright red Amount:  Scant Duration:  1 day Chronicity:  New Context: anal fissures and constipation   Context: not hemorrhoids, not rectal injury and not rectal pain   Similar prior episodes: yes (h/o anal fissure with childbirth.)   Relieved by:  None tried Worsened by:  Nothing Ineffective treatments:  None tried Associated symptoms: abdominal pain   Associated symptoms: no fever and no vomiting     Past Medical History:  Diagnosis Date  . Homozygous for MTHFR gene mutation (HCC) 2012  . Hx of varicella   . Hypothyroidism     Patient Active Problem List   Diagnosis Date Noted  . Vaginal delivery 11/30/2014  . Knee pain 07/04/2014  . Hypothyroidism 07/04/2014  . Homozygous for MTHFR gene mutation (HCC) 07/04/2014  . Vitamin D deficiency 07/04/2014  . Obesity 07/04/2014  . Rh negative state in antepartum period 07/04/2014    Past Surgical History:  Procedure Laterality Date  . WISDOM TOOTH EXTRACTION Left 2007    OB History    Gravida Para Term Preterm AB Living   1 1 1     1    SAB TAB Ectopic Multiple Live Births         0 1       Home Medications    Prior to Admission medications   Medication Sig Start Date End Date Taking? Authorizing Provider  docusate sodium (COLACE) 100 MG capsule Take 1 capsule (100 mg total) by mouth every 12 (twelve) hours. 12/30/14   Raeford Razor, MD  lidocaine (XYLOCAINE) 2 % jelly Apply 1 application topically as needed. 12/30/14   Raeford Razor, MD  mineral oil enema Place 133 mLs (1 enema total) rectally once. 12/30/14   Raeford Razor, MD  polyethylene glycol [facility] / Ethelene Hal) packet Take 17 g by mouth 2 (two) times daily as  needed. 12/30/14   Raeford Razor, MD  Prenatal Vit-Fe Fumarate-FA (PRENATAL MULTIVITAMIN) TABS tablet Take 1 tablet by mouth at bedtime.     Historical Provider, MD  thyroid (ARMOUR) 30 MG tablet Take 30-60 mg by mouth 2 (two) times daily. 60mg  in the morning and 30 mg at lunch time    Historical Provider, MD    Family History Family History  Problem Relation Age of Onset  . Stroke Mother   . Cancer Maternal Grandmother     colon    Social History Social History  Substance Use Topics  . Smoking status: Never Smoker  . Smokeless tobacco: Never Used  . Alcohol use No     Allergies   Review of patient's allergies indicates no known allergies.   Review of Systems Review of Systems  Constitutional: Negative.  Negative for fever.  Respiratory: Negative.   Cardiovascular: Negative.   Gastrointestinal: Positive for abdominal pain, blood in stool, constipation and hematochezia. Negative for diarrhea, nausea, rectal pain and vomiting.  Genitourinary: Negative.   All other systems reviewed and are negative.    Physical Exam Triage Vital Signs ED Triage Vitals [02/16/16 1823]  Enc Vitals Group     BP 101/73     Pulse Rate 84     Resp  Temp 97.9 F (36.6 C)     Temp Source Oral     SpO2 96 %     Weight      Height      Head Circumference      Peak Flow      Pain Score      Pain Loc      Pain Edu?      Excl. in GC?    No data found.   Updated Vital Signs BP 101/73 (BP Location: Right Arm)   Pulse 84   Temp 97.9 F (36.6 C) (Oral)   SpO2 96%   Visual Acuity Right Eye Distance:   Left Eye Distance:   Bilateral Distance:    Right Eye Near:   Left Eye Near:    Bilateral Near:     Physical Exam  Constitutional: She appears well-developed and well-nourished.  Abdominal: Soft. Bowel sounds are normal. There is no tenderness.  Genitourinary:  Genitourinary Comments: 3mm superficial anal tear with tiny drop of  brb present and visible with nurse during  exam., no digital exam needed, no hemorrhoids or fissure seen.  Skin: Skin is warm and dry.  Nursing note and vitals reviewed.    UC Treatments / Results  Labs (all labs ordered are listed, but only abnormal results are displayed) Labs Reviewed - No data to display  EKG  EKG Interpretation None       Radiology No results found.  Procedures Procedures (including critical care time)  Medications Ordered in UC Medications - No data to display   Initial Impression / Assessment and Plan / UC Course  I have reviewed the triage vital signs and the nursing notes.  Pertinent labs & imaging results that were available during my care of the patient were reviewed by me and considered in my medical decision making (see chart for details).  Clinical Course      Final Clinical Impressions(s) / UC Diagnoses   Final diagnoses:  None    New Prescriptions New Prescriptions   No medications on file     Linna Hoff, MD 02/16/16 [phone]

## 2016-04-11 ENCOUNTER — Inpatient Hospital Stay (HOSPITAL_COMMUNITY): Payer: Medicaid Other

## 2016-04-11 ENCOUNTER — Inpatient Hospital Stay (HOSPITAL_COMMUNITY)
Admission: AD | Admit: 2016-04-11 | Discharge: 2016-04-12 | Disposition: A | Discharge status: Home / Self Care | Payer: Medicaid Other | Source: Ambulatory Visit | Attending: Family Medicine | Admitting: Family Medicine

## 2016-04-11 ENCOUNTER — Encounter (HOSPITAL_COMMUNITY): Payer: Self-pay | Admitting: *Deleted

## 2016-04-11 DIAGNOSIS — O039 Complete or unspecified spontaneous abortion without complication: Secondary | ICD-10-CM | POA: Diagnosis not present

## 2016-04-11 DIAGNOSIS — N939 Abnormal uterine and vaginal bleeding, unspecified: Secondary | ICD-10-CM | POA: Diagnosis present

## 2016-04-11 DIAGNOSIS — O360110 Maternal care for anti-D [Rh] antibodies, first trimester, not applicable or unspecified: Secondary | ICD-10-CM

## 2016-04-11 DIAGNOSIS — Z79899 Other long term (current) drug therapy: Secondary | ICD-10-CM | POA: Insufficient documentation

## 2016-04-11 DIAGNOSIS — Z3A00 Weeks of gestation of pregnancy not specified: Secondary | ICD-10-CM | POA: Diagnosis not present

## 2016-04-11 DIAGNOSIS — O4691 Antepartum hemorrhage, unspecified, first trimester: Secondary | ICD-10-CM | POA: Insufficient documentation

## 2016-04-11 NOTE — MAU Note (Signed)
Pt reports she started spotting yesterday and was just pink on the tissue when she wiped, tonight it got heavier and she passed some really large clots and was having cramping off/on.

## 2016-04-12 ENCOUNTER — Encounter (HOSPITAL_COMMUNITY): Payer: Self-pay | Admitting: *Deleted

## 2016-04-12 DIAGNOSIS — O039 Complete or unspecified spontaneous abortion without complication: Secondary | ICD-10-CM | POA: Diagnosis not present

## 2016-04-12 LAB — CBC
HEMATOCRIT: 36.2 % (ref 36.0–46.0)
HEMOGLOBIN: 13.1 g/dL (ref 12.0–15.0)
MCH: 29.7 pg (ref 26.0–34.0)
MCHC: 36.2 g/dL — ABNORMAL HIGH (ref 30.0–36.0)
MCV: 82.1 fL (ref 78.0–100.0)
Platelets: 296 10*3/uL (ref 150–400)
RBC: 4.41 MIL/uL (ref 3.87–5.11)
RDW: 12.9 % (ref 11.5–15.5)
WBC: 8 10*3/uL (ref 4.0–10.5)

## 2016-04-12 LAB — HIV ANTIBODY (ROUTINE TESTING W REFLEX): HIV Screen 4th Generation wRfx: NONREACTIVE

## 2016-04-12 LAB — HCG, QUANTITATIVE, PREGNANCY: hCG, Beta Chain, Quant, S: 4841 m[IU]/mL — ABNORMAL HIGH (ref <5)

## 2016-04-12 LAB — RPR: RPR Ser Ql: NONREACTIVE

## 2016-04-12 MED ORDER — LACTATED RINGERS IV BOLUS (SEPSIS)
1000.0000 mL | Freq: Once | INTRAVENOUS | Status: AC
  Administered 2016-04-12: 1000 mL via INTRAVENOUS

## 2016-04-12 MED ORDER — RHO D IMMUNE GLOBULIN 1500 UNIT/2ML IJ SOSY
300.0000 ug | PREFILLED_SYRINGE | Freq: Once | INTRAMUSCULAR | Status: AC
  Administered 2016-04-12: 300 ug via INTRAMUSCULAR
  Filled 2016-04-12: qty 2

## 2016-04-12 MED ORDER — MISOPROSTOL 200 MCG PO TABS
600.0000 ug | ORAL_TABLET | Freq: Once | ORAL | Status: AC
  Administered 2016-04-12: 600 ug via BUCCAL
  Filled 2016-04-12: qty 3

## 2016-04-12 NOTE — Discharge Instructions (Signed)
Miscarriage  A miscarriage is the sudden loss of an unborn baby (fetus) before the 20th week of pregnancy. Most miscarriages happen in the first 3 months of pregnancy. Sometimes, it happens before a woman even knows she is pregnant. A miscarriage is also called a "spontaneous miscarriage" or "early pregnancy loss." Having a miscarriage can be an emotional experience. Talk with your caregiver about any questions you may have about miscarrying, the grieving process, and your future pregnancy plans.  CAUSES    Problems with the fetal chromosomes that make it impossible for the baby to develop normally. Problems with the baby's genes or chromosomes are most often the result of errors that occur, by chance, as the embryo divides and grows. The problems are not inherited from the parents.   Infection of the cervix or uterus.    Hormone problems.    Problems with the cervix, such as having an incompetent cervix. This is when the tissue in the cervix is not strong enough to hold the pregnancy.    Problems with the uterus, such as an abnormally shaped uterus, uterine fibroids, or congenital abnormalities.    Certain medical conditions.    Smoking, drinking alcohol, or taking illegal drugs.    Trauma.   Often, the cause of a miscarriage is unknown.   SYMPTOMS    Vaginal bleeding or spotting, with or without cramps or pain.   Pain or cramping in the abdomen or lower back.   Passing fluid, tissue, or blood clots from the vagina.  DIAGNOSIS   Your caregiver will perform a physical exam. You may also have an ultrasound to confirm the miscarriage. Blood or urine tests may also be ordered.  TREATMENT    Sometimes, treatment is not necessary if you naturally pass all the fetal tissue that was in the uterus. If some of the fetus or placenta remains in the body (incomplete miscarriage), tissue left behind may become infected and must be removed. Usually, a dilation and curettage (D and C) procedure is performed.  During a D and C procedure, the cervix is widened (dilated) and any remaining fetal or placental tissue is gently removed from the uterus.   Antibiotic medicines are prescribed if there is an infection. Other medicines may be given to reduce the size of the uterus (contract) if there is a lot of bleeding.   If you have Rh negative blood and your baby was Rh positive, you will need a Rh immunoglobulin shot. This shot will protect any future baby from having Rh blood problems in future pregnancies.  HOME CARE INSTRUCTIONS    Your caregiver may order bed rest or may allow you to continue light activity. Resume activity as directed by your caregiver.   Have someone help with home and family responsibilities during this time.    Keep track of the number of sanitary pads you use each day and how soaked (saturated) they are. Write down this information.    Do not use tampons. Do not douche or have sexual intercourse until approved by your caregiver.    Only take over-the-counter or prescription medicines for pain or discomfort as directed by your caregiver.    Do not take aspirin. Aspirin can cause bleeding.    Keep all follow-up appointments with your caregiver.    If you or your partner have problems with grieving, talk to your caregiver or seek counseling to help cope with the pregnancy loss. Allow enough time to grieve before trying to get pregnant again.     SEEK IMMEDIATE MEDICAL CARE IF:    You have severe cramps or pain in your back or abdomen.   You have a fever.   You pass large blood clots (walnut-sized or larger) ortissue from your vagina. Save any tissue for your caregiver to inspect.    Your bleeding increases.    You have a thick, bad-smelling vaginal discharge.   You become lightheaded, weak, or you faint.    You have chills.   MAKE SURE YOU:   Understand these instructions.   Will watch your condition.   Will get help right away if you are not doing well or get worse.     This  information is not intended to replace advice given to you by your health care provider. Make sure you discuss any questions you have with your health care provider.     Document Released: 12/28/2000 Document Revised: 10/29/2012 Document Reviewed: 08/23/2011  Elsevier Interactive Patient Education 2016 [employer]

## 2016-04-12 NOTE — MAU Note (Addendum)
Pt started to feel light headed while performing pericare. Pt placed in supine position. CNM called to bedside. IV started bolus infusing.

## 2016-04-12 NOTE — MAU Provider Note (Signed)
History     CSN: [id number]  Arrival date and time: 04/11/16 2235   First Provider Initiated Contact with Patient 04/12/16 0046      Chief Complaint  Patient presents with  . Vaginal Bleeding   Vaginal Bleeding  The patient's primary symptoms include pelvic pain and vaginal bleeding. This is a new problem. The current episode started yesterday. The problem occurs constantly. The problem has been gradually worsening. The pain is moderate. The problem affects both sides. She is pregnant. Associated symptoms include abdominal pain and nausea. Pertinent negatives include no chills, constipation, diarrhea, dysuria, fever, frequency, urgency or vomiting. The vaginal discharge was bloody. The vaginal bleeding is heavier than menses. She has been passing clots. She has not been passing tissue (unsure ). Nothing aggravates the symptoms. She has tried nothing for the symptoms. Menstrual history: LMP 01/28/16     Past Medical History:  Diagnosis Date  . Homozygous for MTHFR gene mutation (HCC) 2012  . Hx of varicella   . Hypothyroidism     Past Surgical History:  Procedure Laterality Date  . WISDOM TOOTH EXTRACTION Left 2007    Family History  Problem Relation Age of Onset  . Stroke Mother   . Cancer Maternal Grandmother     colon    Social History  Substance Use Topics  . Smoking status: Never Smoker  . Smokeless tobacco: Never Used  . Alcohol use No    Allergies: No Known Allergies  Prescriptions Prior to Admission  Medication Sig Dispense Refill Last Dose  . Prenatal Vit-Fe Fumarate-FA (PRENATAL MULTIVITAMIN) TABS tablet Take 1 tablet by mouth at bedtime.    04/11/2016 at Unknown time  . thyroid (ARMOUR) 30 MG tablet Take 30-60 mg by mouth 2 (two) times daily. 60mg  in the morning and 30 mg at lunch time   04/11/2016 at Unknown time  . docusate sodium (COLACE) 100 MG capsule Take 1 capsule (100 mg total) by mouth every 12 (twelve) hours. 60 capsule 0 More than a month at Unknown  time  . hydrocortisone (ANUSOL-HC) 25 MG suppository Place 1 suppository (25 mg total) rectally 2 (two) times daily. 12 suppository 0 More than a month at Unknown time  . lidocaine (XYLOCAINE) 2 % jelly Apply 1 application topically as needed. 30 mL 0 More than a month at Unknown time  . mineral oil enema Place 133 mLs (1 enema total) rectally once. 133 mL 0 More than a month at Unknown time  . polyethylene glycol (MIRALAX / GLYCOLAX) packet Take 17 g by mouth 2 (two) times daily as needed. 24 each 0 More than a month at Unknown time    Review of Systems  Constitutional: Negative for chills and fever.  Gastrointestinal: Positive for abdominal pain and nausea. Negative for constipation, diarrhea and vomiting.  Genitourinary: Positive for pelvic pain and vaginal bleeding. Negative for dysuria, frequency and urgency.  Neurological: Positive for dizziness.   Physical Exam   Blood pressure 103/69, pulse 114, temperature 98.4 F (36.9 C), resp. rate 18, height 5\' 3"  (1.6 m), weight 196 lb (88.9 kg), last menstrual period 01/28/2016, SpO2 98 %, currently breastfeeding.  Physical Exam  Nursing note and vitals reviewed. Constitutional: She is oriented to person, place, and time. She appears well-developed and well-nourished. No distress.  HENT:  Head: Normocephalic.  Cardiovascular: Normal rate.   Respiratory: Effort normal.  GI: Soft. There is no tenderness. There is no rebound.  Genitourinary:  Genitourinary Comments:  External: no lesion Vagina: moderate amount of  BRB in the vagina.  Cervix: pink, smooth, no CMT, small amount of clot in the os. Removed  Uterus: NT, Non-enlarged    Neurological: She is alert and oriented to person, place, and time.  Skin: Skin is warm and dry.  Psychiatric: She has a normal mood and affect.    MAU Course  Procedures  MDM Patient arrived back from US with a near-syncopal episode. Placed in trendelenburg, IV started.  Dr. Adrian BlackwaterStinson notified Patient  given 600mcg of cytotec buccally 0310: Dr. Adrian BlackwaterStinson in to see the patient.  Patient declines surgery today. Will FU later this week.  Patient given rhogam here today   Assessment and Plan   1. SAB (spontaneous abortion)   2. Rh negative state in antepartum period, first trimester, not applicable or unspecified fetus    DC home Comfort measures reviewed  Bleeding precautions RX: none Return to MAU as needed   Follow-up Information    [facility]   Specialty:  Obstetrics and Gynecology Contact information: [address] [place] [id number] [phone]           Tawnya CrookHogan, Gregg Holster Donovan 04/12/2016, 12:46 AM

## 2016-04-13 LAB — RH IG WORKUP (INCLUDES ABO/RH)
ABO/RH(D): O NEG
Antibody Screen: NEGATIVE
Gestational Age(Wks): 10
Unit division: 0

## 2016-04-15 ENCOUNTER — Ambulatory Visit (INDEPENDENT_AMBULATORY_CARE_PROVIDER_SITE_OTHER): Payer: Medicaid Other | Admitting: Family Medicine

## 2016-04-15 ENCOUNTER — Encounter: Payer: Self-pay | Admitting: Family Medicine

## 2016-04-15 VITALS — BP 103/68 | HR 97 | Temp 98.3°F | Ht 63.0 in | Wt 194.4 lb

## 2016-04-15 DIAGNOSIS — O034 Incomplete spontaneous abortion without complication: Secondary | ICD-10-CM

## 2016-04-15 DIAGNOSIS — O360110 Maternal care for anti-D [Rh] antibodies, first trimester, not applicable or unspecified: Secondary | ICD-10-CM

## 2016-04-15 LAB — HCG, QUANTITATIVE, PREGNANCY: hCG, Beta Chain, Quant, S: 2291.5 m[IU]/mL — ABNORMAL HIGH

## 2016-04-15 NOTE — Progress Notes (Signed)
   Subjective:    Patient ID: Lori Grant, female    DOB: Aug 17, 1980, 35 y.o.   MRN: [id number]  HPI Pt seen for follow up of miscarriage 5 days ago. She received cytotec in MAU due to heavy bleeding and feeling syncopal. She was offered D&E at that time, but the patient declined. She reports that she passed several clots the next day and then has had light bleeding since then. She reports occasional lightheadedness when ambulating, but this is improving. She denies fevers, chills, nausea, vomiting. Mild uterine cramping.   Review of Systems     Objective:   Physical Exam  Constitutional: She is oriented to person, place, and time. She appears well-developed and well-nourished.  HENT:  Head: Normocephalic and atraumatic.  Cardiovascular: Normal rate, regular rhythm and normal heart sounds.   Pulmonary/Chest: Effort normal and breath sounds normal. No respiratory distress. She has no wheezes. She has no rales. She exhibits no tenderness.  Abdominal: Soft. She exhibits no distension. There is no tenderness. There is no rebound and no guarding.  Neurological: She is alert and oriented to person, place, and time.  Skin: Skin is warm and dry.  Psychiatric: She has a normal mood and affect. Her behavior is normal. Judgment and thought content normal.      Assessment & Plan:  1. Incomplete spontaneous abortion Will check quant to assure completed SAB. - hCG, quantitative, pregnancy  2. Rh negative state in antepartum period, first trimester, not applicable or unspecified fetus Rhogam given in MAU.

## 2016-04-16 ENCOUNTER — Encounter: Payer: Self-pay | Admitting: Family Medicine

## 2016-04-19 ENCOUNTER — Other Ambulatory Visit: Payer: Medicaid Other

## 2016-04-19 DIAGNOSIS — O034 Incomplete spontaneous abortion without complication: Secondary | ICD-10-CM

## 2016-04-19 LAB — HCG, QUANTITATIVE, PREGNANCY: hCG, Beta Chain, Quant, S: 1130.9 m[IU]/mL — ABNORMAL HIGH

## 2016-04-20 ENCOUNTER — Telehealth: Payer: Self-pay | Admitting: Family Medicine

## 2016-04-20 NOTE — Telephone Encounter (Signed)
Called pt to inform of results - passed sack last night. Bleeding improved. A little orthostatic, but improving. Recommended iron supplement.

## 2016-04-26 ENCOUNTER — Other Ambulatory Visit: Payer: Medicaid Other

## 2016-04-26 DIAGNOSIS — O3680X0 Pregnancy with inconclusive fetal viability, not applicable or unspecified: Secondary | ICD-10-CM

## 2016-04-27 ENCOUNTER — Telehealth: Payer: Self-pay

## 2016-04-27 LAB — HCG, QUANTITATIVE, PREGNANCY: HCG, BETA CHAIN, QUANT, S: 44.7 m[IU]/mL — AB

## 2016-04-27 NOTE — Telephone Encounter (Signed)
Can we call patient and let her know her levels are dropping appropriately. I attempted to call patient but there was no answer or voicemail to leave a message.

## 2016-04-28 ENCOUNTER — Telehealth: Payer: Self-pay | Admitting: *Deleted

## 2016-04-28 NOTE — Telephone Encounter (Signed)
Called pt and left message stating that I am calling with test result information. Please call back and state whether a detailed message can be left on voice mail. Pt needs to be informed that her hormone level is dropping appropriately. She will need repeat BHCG test on or after 10/17.

## 2016-04-29 NOTE — Telephone Encounter (Signed)
Patient returned call, ok to leave a detailed message about test results.

## 2016-04-29 NOTE — Telephone Encounter (Signed)
Called patient and relayed beta hcg results and need to schedule another blood draw on or after 10/17. Understanding was voiced.

## 2016-05-10 ENCOUNTER — Other Ambulatory Visit: Payer: Medicaid Other

## 2016-05-10 DIAGNOSIS — O039 Complete or unspecified spontaneous abortion without complication: Secondary | ICD-10-CM

## 2016-05-10 NOTE — Progress Notes (Unsigned)
Pt in for repeat hcg level.

## 2016-05-11 LAB — HCG, QUANTITATIVE, PREGNANCY: hCG, Beta Chain, Quant, S: <2 m[IU]/mL

## 2016-06-22 ENCOUNTER — Ambulatory Visit (HOSPITAL_COMMUNITY)
Admission: EM | Admit: 2016-06-22 | Discharge: 2016-06-22 | Disposition: A | Discharge status: Home / Self Care | Payer: Medicaid Other | Attending: Family Medicine | Admitting: Family Medicine

## 2016-06-22 ENCOUNTER — Encounter (HOSPITAL_COMMUNITY): Payer: Self-pay | Admitting: Family Medicine

## 2016-06-22 DIAGNOSIS — N63 Unspecified lump in unspecified breast: Secondary | ICD-10-CM

## 2016-06-22 MED ORDER — CEPHALEXIN 500 MG PO CAPS: 500.0000 mg | ORAL_CAPSULE | Freq: Three times a day (TID) | ORAL | 0 refills | Status: DC

## 2016-06-22 NOTE — Discharge Instructions (Signed)
You need to follow-up in 10 days to ensure that this nodule has resolved. In the meantime, continue using hot compresses and continue nursing.

## 2016-06-22 NOTE — ED Triage Notes (Signed)
[facility] a [facility] lump on her right breast that she noticed Sunday.

## 2016-06-22 NOTE — ED Provider Notes (Signed)
[facility]    CSN: [id number] Arrival date & time: 06/22/16  1816     History   Chief Complaint Chief Complaint  Patient presents with  . Breast Mass    HPI Lori Grant is a 35 y.o. female.   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 This began approximately 3 days ago. She's tried massaging and putting heat on it but is gradually gotten larger and more tender.      Past Medical History:  Diagnosis Date  . Homozygous for MTHFR gene mutation (HCC) 2012  . Hx of varicella   . Hypothyroidism     Patient Active Problem List   Diagnosis Date Noted  . Vaginal delivery 11/30/2014  . Knee pain 07/04/2014  . Hypothyroidism 07/04/2014  . Homozygous for MTHFR gene mutation (HCC) 07/04/2014  . Vitamin D deficiency 07/04/2014  . Obesity 07/04/2014  . Rh negative state in antepartum period 07/04/2014    Past Surgical History:  Procedure Laterality Date  . WISDOM TOOTH EXTRACTION Left 2007    OB History    Gravida Para Term Preterm AB Living   2 1 1     1    SAB TAB Ectopic Multiple Live Births         0 1       Home Medications    Prior to Admission medications   Medication Sig Start Date End Date Taking? Authorizing Provider  cephALEXin (KEFLEX) 500 MG capsule Take 1 capsule (500 mg total) by mouth 3 (three) times daily. 06/22/16   Elvina SidleKurt Kaylina Cahue, MD  docusate sodium (COLACE) 100 MG capsule Take 1 capsule (100 mg total) by mouth every 12 (twelve) hours. 12/30/14   Raeford RazorStephen Kohut, MD  hydrocortisone (ANUSOL-HC) 25 MG suppository Place 1 suppository (25 mg total) rectally 2 (two) times daily. 02/16/16   Linna HoffJames D Kindl, MD  lidocaine (XYLOCAINE) 2 % jelly Apply 1 application topically as needed. 12/30/14   Raeford RazorStephen Kohut, MD  mineral oil enema Place 133 mLs (1 enema total) rectally once. 12/30/14   Raeford RazorStephen Kohut, MD  polyethylene glycol [place](MIRALAX / Ethelene HalGLYCOLAX) packet Take  17 g by mouth 2 (two) times daily as needed. 12/30/14   Raeford RazorStephen Kohut, MD  Prenatal Vit-Fe Fumarate-FA (PRENATAL MULTIVITAMIN) TABS tablet Take 1 tablet by mouth at bedtime.     Historical Provider, MD  thyroid (ARMOUR) 30 MG tablet Take 30-60 mg by mouth 2 (two) times daily. 60mg  in the morning and 30 mg at lunch time    Historical Provider, MD    Family History Family History  Problem Relation Age of Onset  . Stroke Mother   . Cancer Maternal Grandmother     colon    Social History Social History  Substance Use Topics  . Smoking status: Never Smoker  . Smokeless tobacco: Never Used  . Alcohol use No     Allergies   Patient has no known allergies.   Review of Systems Review of Systems  Constitutional: Negative.   HENT: Negative.   Cardiovascular: Positive for chest pain.  Genitourinary: Negative.   Neurological: Negative.      Physical Exam Triage Vital Signs ED Triage Vitals  Enc Vitals Group     BP 06/22/16 1844 109/74     Pulse Rate 06/22/16 1844 74     Resp 06/22/16 1844 18     Temp 06/22/16 1844 98.2 F (36.8 C)  Temp Source 06/22/16 1844 Oral     SpO2 06/22/16 1844 96 %     Weight --      Height --      Head Circumference --      Peak Flow --      Pain Score 06/22/16 1847 4     Pain Loc --      Pain Edu? --      Excl. in GC? --    No data found.   Updated Vital Signs BP 109/74 (BP Location: Right Arm)   Pulse 74   Temp 98.2 F (36.8 C) (Oral)   Resp 18   LMP 01/28/2016   SpO2 96%   Breastfeeding? Yes    Physical Exam  Constitutional: She appears well-developed and well-nourished.  HENT:  Head: Normocephalic.  Right Ear: External ear normal.  Left Ear: External ear normal.  Mouth/Throat: Oropharynx is clear and moist.  Eyes: Conjunctivae and EOM are normal.  Neck: Normal range of motion. Neck supple.  Skin: Skin is warm and dry.  Right breast exam:  2 cm right upper outer bilateral breast nodule consistent with blocked duct. The  nodule is mobile and non-fixed and minimally tender. There is slight discoloration on the overlying skin.   Nursing note and vitals reviewed.    UC Treatments / Results  Labs (all labs ordered are listed, but only abnormal results are displayed) Labs Reviewed - No data to display  EKG  EKG Interpretation None       Radiology No results found.  Procedures Procedures (including critical care time)  Medications Ordered in UC Medications - No data to display   Initial Impression / Assessment and Plan / UC Course  I have reviewed the triage vital signs and the nursing notes.  Pertinent labs & imaging results that were available during my care of the patient were reviewed by me and considered in my medical decision making (see chart for details).  Clinical Course      Final Clinical Impressions(s) / UC Diagnoses   Final diagnoses:  Subcutaneous nodule of breast    New Prescriptions New Prescriptions   CEPHALEXIN (KEFLEX) 500 MG CAPSULE    Take 1 capsule (500 mg total) by mouth 3 (three) times daily.     Elvina SidleKurt Bexleigh Theriault, MD 06/22/16 Windell [id number]

## 2016-06-23 ENCOUNTER — Other Ambulatory Visit: Payer: Self-pay | Admitting: Obstetrics and Gynecology

## 2016-06-23 DIAGNOSIS — N6311 Unspecified lump in the right breast, upper outer quadrant: Secondary | ICD-10-CM

## 2016-06-24 ENCOUNTER — Telehealth: Payer: Self-pay | Admitting: *Deleted

## 2016-06-24 NOTE — Telephone Encounter (Signed)
Patient left message, is concerned about a lump in her breast. Wants to be seen. Please call.

## 2016-06-28 ENCOUNTER — Other Ambulatory Visit (HOSPITAL_COMMUNITY): Payer: Self-pay | Admitting: *Deleted

## 2016-06-28 DIAGNOSIS — N6310 Unspecified lump in the right breast, unspecified quadrant: Secondary | ICD-10-CM

## 2016-06-30 ENCOUNTER — Ambulatory Visit (HOSPITAL_COMMUNITY)
Admission: RE | Admit: 2016-06-30 | Discharge: 2016-06-30 | Disposition: A | Discharge status: Home / Self Care | Payer: Self-pay | Source: Ambulatory Visit | Attending: Obstetrics and Gynecology | Admitting: Obstetrics and Gynecology

## 2016-06-30 ENCOUNTER — Ambulatory Visit
Admission: RE | Admit: 2016-06-30 | Discharge: 2016-06-30 | Disposition: A | Discharge status: Home / Self Care | Payer: No Typology Code available for payment source | Source: Ambulatory Visit | Attending: Obstetrics and Gynecology | Admitting: Obstetrics and Gynecology

## 2016-06-30 ENCOUNTER — Encounter (HOSPITAL_COMMUNITY): Payer: Self-pay | Admitting: *Deleted

## 2016-06-30 ENCOUNTER — Other Ambulatory Visit (HOSPITAL_COMMUNITY): Payer: Self-pay | Admitting: *Deleted

## 2016-06-30 VITALS — BP 114/70 | Temp 97.7°F | Ht 63.0 in | Wt 190.6 lb

## 2016-06-30 DIAGNOSIS — N6320 Unspecified lump in the left breast, unspecified quadrant: Secondary | ICD-10-CM

## 2016-06-30 DIAGNOSIS — N6310 Unspecified lump in the right breast, unspecified quadrant: Secondary | ICD-10-CM

## 2016-06-30 DIAGNOSIS — Z1239 Encounter for other screening for malignant neoplasm of breast: Secondary | ICD-10-CM

## 2016-06-30 DIAGNOSIS — N6311 Unspecified lump in the right breast, upper outer quadrant: Secondary | ICD-10-CM

## 2016-06-30 NOTE — Patient Instructions (Signed)
Explained breast self awareness with Lori Grant. Patient did not need a Pap smear today due to last Pap smear was in 2018 per patient. Let her know [facility] will cover Pap smears every 3 years unless has a history of abnormal Pap smears. Reminded her that her next Pap smear is due next year and to call Sabrina to schedule with [facility]. [facility] and possible right breast ultrasound. Appointment scheduled for Thursday, June 30, 2016 at 1330. Lori Grant verbalized understanding.  Armida Vickroy, Kathaleen Maserhristine Poll, RN 1:34 PM

## 2016-06-30 NOTE — Progress Notes (Signed)
Complaints of right breast lump x 1 1/2 weeks and a rice sized right breast lump since yesterday. Patient stated both areas were sore to the touch at first but is currently not painful.  Pap Smear: Pap smear not completed today. Last [facility] smear was in 2015 at [facility] and normal per patient. Per patient has no history of an abnormal Pap smear. No Pap smear results are in EPIC.  Physical exam: Breasts Breasts symmetrical. No skin abnormalities bilateral breasts. No nipple retraction bilateral breasts. No nipple discharge bilateral breasts. No lymphadenopathy. No lumps palpated left breast. Palpated a lump within the right breast at 10 o'clock 5 cm from the nipple. No complaints of pain or tenderness on exam. [facility] and possible right breast ultrasound. Appointment scheduled for Thursday, June 30, 2016 at 1330.        Pelvic/Bimanual No Pap smear completed today since last Pap smear was in 2015 per patient. Pap smear not indicated per BCCCP guidelines.   Smoking History: Patient has never smoked.  Patient Navigation: Patient education provided. Access to services [facility].

## 2016-07-01 ENCOUNTER — Encounter (HOSPITAL_COMMUNITY): Payer: Self-pay | Admitting: *Deleted

## 2016-07-14 ENCOUNTER — Encounter (HOSPITAL_COMMUNITY): Payer: Self-pay | Admitting: Family Medicine

## 2016-07-14 ENCOUNTER — Ambulatory Visit (HOSPITAL_COMMUNITY)
Admission: EM | Admit: 2016-07-14 | Discharge: 2016-07-14 | Disposition: A | Discharge status: Home / Self Care | Payer: No Typology Code available for payment source | Attending: Family Medicine | Admitting: Family Medicine

## 2016-07-14 DIAGNOSIS — R300 Dysuria: Secondary | ICD-10-CM

## 2016-07-14 DIAGNOSIS — N3001 Acute cystitis with hematuria: Secondary | ICD-10-CM

## 2016-07-14 LAB — POCT URINALYSIS DIP (DEVICE)
BILIRUBIN URINE: NEGATIVE
Glucose, UA: NEGATIVE mg/dL
Ketones, ur: NEGATIVE mg/dL
Nitrite: NEGATIVE
Protein, ur: NEGATIVE mg/dL
SPECIFIC GRAVITY, URINE: 1.015 (ref 1.005–1.030)
Urobilinogen, UA: 0.2 mg/dL (ref 0.0–1.0)
pH: 6.5 (ref 5.0–8.0)

## 2016-07-14 MED ORDER — PHENAZOPYRIDINE HCL 200 MG PO TABS: 200.0000 mg | ORAL_TABLET | Freq: Three times a day (TID) | ORAL | 0 refills | Status: DC

## 2016-07-14 MED ORDER — NITROFURANTOIN MONOHYD MACRO 100 MG PO CAPS: 100.0000 mg | ORAL_CAPSULE | Freq: Two times a day (BID) | ORAL | 0 refills | Status: DC

## 2016-07-14 NOTE — ED Triage Notes (Signed)
Pt here with UTI symptoms and lower back pain that has been worsening, sts also sharp pain in vaginal area.

## 2016-07-14 NOTE — ED Provider Notes (Signed)
CSN: [id number]     Arrival date & time 07/14/16  1815 History   First MD Initiated Contact with Patient 07/14/16 1919     Chief Complaint  Patient presents with  . Dysuria   (Consider location/radiation/quality/duration/timing/severity/associated sxs/prior Treatment) Patient with c/o dysuria and bladder discomfort .   The history is provided by the patient.  Dysuria  Pain quality:  Aching and burning Onset quality:  Sudden Duration:  2 days Timing:  Constant Progression:  Worsening Chronicity:  New Recent urinary tract infections: no   Relieved by:  Nothing Worsened by:  Nothing Ineffective treatments:  None tried   Past Medical History:  Diagnosis Date  . Homozygous for MTHFR gene mutation (HCC) 2012  . Hx of varicella   . Hypothyroidism    Past Surgical History:  Procedure Laterality Date  . WISDOM TOOTH EXTRACTION Left 2007   Family History  Problem Relation Age of Onset  . Stroke Mother   . Cancer Maternal Grandmother     colon   Social History  Substance Use Topics  . Smoking status: Never Smoker  . Smokeless tobacco: Never Used  . Alcohol use No   OB History    Gravida Para Term Preterm AB Living   2 1 1     1    SAB TAB Ectopic Multiple Live Births         0 1     Review of Systems  Constitutional: Negative.   HENT: Negative.   Eyes: Negative.   Respiratory: Negative.   Cardiovascular: Negative.   Gastrointestinal: Negative.   Endocrine: Negative.   Genitourinary: Positive for dysuria.  Musculoskeletal: Negative.   Skin: Negative.   Neurological: Negative.   Hematological: Negative.   Psychiatric/Behavioral: Negative.     Allergies  Patient has no known allergies.  Home Medications   Prior to Admission medications   Medication Sig Start Date End Date Taking? Authorizing Provider  cephALEXin (KEFLEX) 500 MG capsule Take 1 capsule (500 mg total) by mouth 3 (three) times daily. Patient not taking: Reported on 06/30/2016 06/22/16   Elvina SidleKurt  Lauenstein, MD  docusate sodium (COLACE) 100 MG capsule Take 1 capsule (100 mg total) by mouth every 12 (twelve) hours. Patient not taking: Reported on 06/30/2016 12/30/14   Raeford RazorStephen Kohut, MD  hydrocortisone (ANUSOL-HC) 25 MG suppository Place 1 suppository (25 mg total) rectally 2 (two) times daily. Patient not taking: Reported on 06/30/2016 02/16/16   Linna HoffJames D Kindl, MD  lidocaine (XYLOCAINE) 2 % jelly Apply 1 application topically as needed. Patient not taking: Reported on 06/30/2016 12/30/14   Raeford RazorStephen Kohut, MD  mineral oil enema Place 133 mLs (1 enema total) rectally once. Patient not taking: Reported on 06/30/2016 12/30/14   Raeford RazorStephen Kohut, MD  nitrofurantoin, macrocrystal-monohydrate, (MACROBID) 100 MG capsule Take 1 capsule (100 mg total) by mouth 2 (two) times daily. 07/14/16   Deatra CanterWilliam J Elayah Klooster, FNP  phenazopyridine (PYRIDIUM) 200 MG tablet Take 1 tablet (200 mg total) by mouth 3 (three) times daily. 07/14/16   Deatra CanterWilliam J Jerel Sardina, FNP  polyethylene glycol [facility](MIRALAX / Ethelene HalGLYCOLAX) packet Take 17 g by mouth 2 (two) times daily as needed. Patient not taking: Reported on 06/30/2016 12/30/14   Raeford RazorStephen Kohut, MD  Prenatal Vit-Fe Fumarate-FA (PRENATAL MULTIVITAMIN) TABS tablet Take 1 tablet by mouth at bedtime.     Historical Provider, MD  thyroid (ARMOUR) 30 MG tablet Take 30-60 mg by mouth 2 (two) times daily. 60mg  in the morning and 30 mg at lunch time  Historical Provider, MD   Meds Ordered and Administered this Visit  Medications - No data to display  BP 104/57   Pulse 88   Temp 98.4 F (36.9 C)   Resp 18   LMP 06/18/2016 (Exact Date)   SpO2 98%  No data found.   Physical Exam  Constitutional: She is oriented to person, place, and time. She appears well-developed and well-nourished.  HENT:  Head: Normocephalic and atraumatic.  Eyes: EOM are normal. Pupils are equal, round, and reactive to light.  Neck: Normal range of motion. Neck supple.  Cardiovascular: Normal rate, regular rhythm and  normal heart sounds.   Pulmonary/Chest: Effort normal and breath sounds normal.  Abdominal: Soft. Bowel sounds are normal.  Neurological: She is alert and oriented to person, place, and time.  Nursing note and vitals reviewed.   Urgent Care Course   Clinical Course     Procedures (including critical care time)  Labs Review Labs Reviewed  POCT URINALYSIS DIP (DEVICE) - Abnormal; Notable for the following:       Result Value   Hgb urine dipstick TRACE (*)    Leukocytes, UA TRACE (*)    All other components within normal limits    Imaging Review No results found.   Visual Acuity Review  Right Eye Distance:   Left Eye Distance:   Bilateral Distance:    Right Eye Near:   Left Eye Near:    Bilateral Near:         MDM   1. Dysuria   2. Acute cystitis with hematuria    Macrobid 100mg  one po bid x 10 days #20 Pyridium 200mg  one po tid x 2d #6  Push po fluids, rest, tylenol and motrin otc prn as directed for fever, arthralgias, and myalgias.  Follow up prn if sx's continue or persist.    Deatra CanterWilliam J Casy Tavano, FNP 07/14/16 Ernestina [id number]

## 2016-07-15 ENCOUNTER — Telehealth (HOSPITAL_COMMUNITY): Payer: Self-pay | Admitting: Emergency Medicine

## 2016-07-15 LAB — POCT PREGNANCY, URINE: PREG TEST UR: NEGATIVE

## 2016-07-15 MED ORDER — CEPHALEXIN 500 MG PO CAPS: 500.0000 mg | ORAL_CAPSULE | Freq: Four times a day (QID) | ORAL | 0 refills | Status: DC

## 2016-07-15 NOTE — Telephone Encounter (Signed)
Pt called and states that she was seen yesterday for acute cystitis and was given Macrobid  States she is unable to afford the antibiotic and wanted to know if we can call in another antibiotic  Per Leonie GreenBill O, PA... Ok to call in Keflex QID x7 days  Per pt's request... Called in medication to [facility]([place])  Adv pt to call back if she has any further problems or questions  Pt verb understanding.

## 2016-07-15 NOTE — Addendum Note (Signed)
Addended by: Hale DroneMEZA, Charmagne Buhl E on: 07/15/2016 05:53 PM   Modules accepted: Orders

## 2016-07-16 IMAGING — DX DG ABDOMEN 1V
1 series · 1 of 1 positions shown · non-contrast
Comparison: None

CLINICAL DATA: LEFT side and lower abdominal pain

EXAM:
ABDOMEN - 1 VIEW

[abdomen kub]
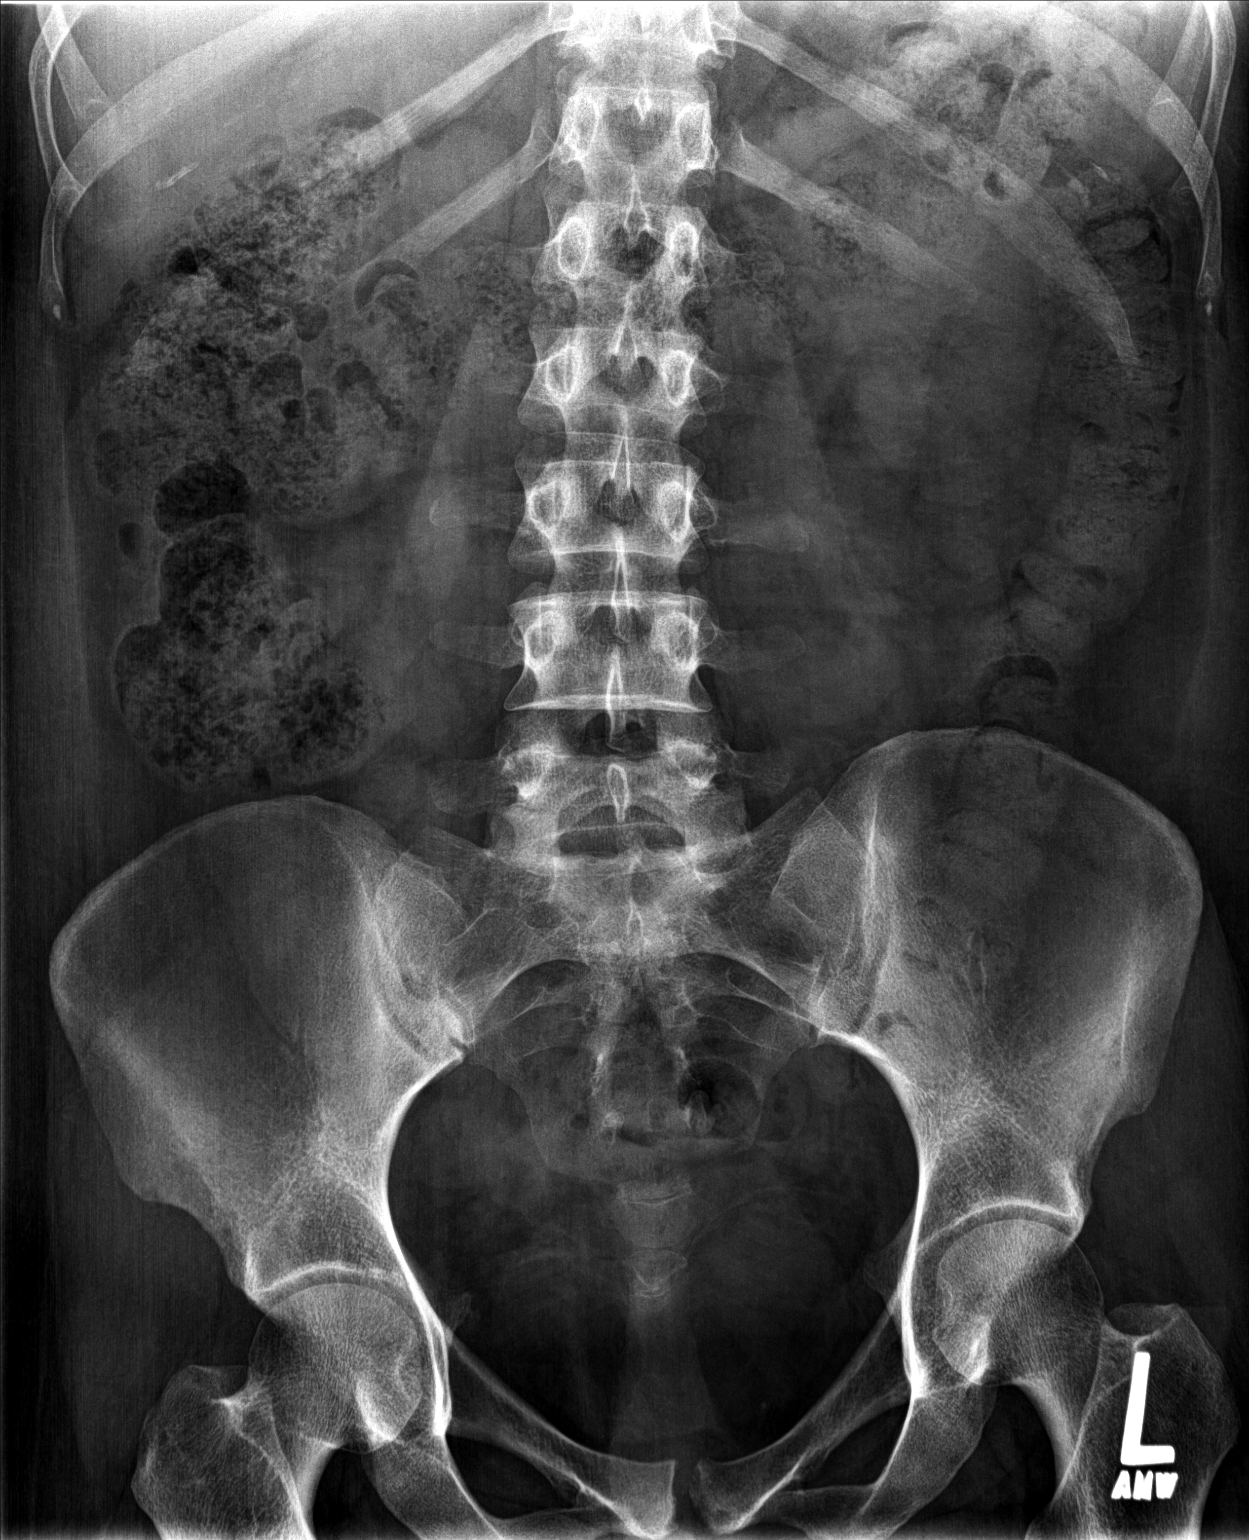

[1 of 1 positions shown; findings below may reference images not displayed]

FINDINGS: Scattered mildly prominent stool throughout the proximal [DATE] of the
colon.

Normal small bowel gas pattern.

No bowel dilatation or wall thickening.

Osseous structures unremarkable.

No urinary tract calcification.
IMPRESSION: Mildly prominent stool in colon.

## 2016-08-09 ENCOUNTER — Ambulatory Visit (HOSPITAL_COMMUNITY)
Admission: RE | Admit: 2016-08-09 | Discharge: 2016-08-09 | Disposition: A | Discharge status: Home / Self Care | Payer: Self-pay | Source: Ambulatory Visit | Attending: Obstetrics and Gynecology | Admitting: Obstetrics and Gynecology

## 2016-08-09 ENCOUNTER — Encounter (HOSPITAL_COMMUNITY): Payer: Self-pay

## 2016-08-09 VITALS — BP 110/68 | Temp 97.7°F | Ht 63.0 in | Wt 194.2 lb

## 2016-08-09 DIAGNOSIS — Z01419 Encounter for gynecological examination (general) (routine) without abnormal findings: Secondary | ICD-10-CM

## 2016-08-09 NOTE — Progress Notes (Signed)
Complaints of a reddened sore area on right breast.  Pap Smear: Pap smear completed today. [facility] and normal per patient. Per patient has no history of an abnormal Pap smear. No Pap smear results are in EPIC.  Physical exam: Breasts Area of concern on right breast looks like a pimple or sore on the skin. Suggested to patient to try either OTC neosporin or Aquaphor.Told patient if it becomes infected appearing, notices a lump under the skin, or becomes painful to give us a call.  Pelvic/Bimanual   Ext Genitalia No lesions, no swelling and no discharge observed on external genitalia.         Vagina Vagina pink and normal texture. No lesions or discharge observed in vagina.          Cervix Cervix is present. Cervix pink and of normal texture. No discharge observed.     Uterus Uterus is present and palpable. Uterus in normal position and normal size.        Adnexae Bilateral ovaries present and palpable. No tenderness on palpation.          Rectovaginal No rectal exam completed today since patient had no rectal complaints. No skin abnormalities observed on exam.    Smoking History: Patient has never smoked.  Patient Navigation: Patient education provided. [facility].

## 2016-08-09 NOTE — Patient Instructions (Addendum)
Explained to Lori Grant that  BCCCP will cover Pap smears and HPV typing every 5 years unless has a history of abnormal Pap smears. Let patient know will follow up with her within the next couple weeks with results of Pap smear by phone. Suggested to patient to try either OTC neosporin or Aquaphor.Told patient if it becomes infected, notices a lump under the skin, or becomes painful to give us a call. Lori Grant verbalized understanding.  [name], Kathaleen Maserhristine Poll, RN 3:25 PM

## 2016-08-09 NOTE — Progress Notes (Signed)
PAP SMEAR COMPLETED

## 2016-08-12 ENCOUNTER — Telehealth (HOSPITAL_COMMUNITY): Payer: Self-pay | Admitting: *Deleted

## 2016-08-12 LAB — CYTOLOGY - PAP
Diagnosis: NEGATIVE
HPV (WINDOPATH): NOT DETECTED

## 2016-08-12 NOTE — Telephone Encounter (Signed)
Telephoned patient at home number and discussed negative pap smear results. HPV was negative. Next pap smear due in five years. Patient voiced understanding.  

## 2016-08-24 ENCOUNTER — Ambulatory Visit (HOSPITAL_COMMUNITY)
Admission: EM | Admit: 2016-08-24 | Discharge: 2016-08-24 | Disposition: A | Discharge status: Home / Self Care | Payer: No Typology Code available for payment source | Attending: Family Medicine | Admitting: Family Medicine

## 2016-08-24 ENCOUNTER — Encounter (HOSPITAL_COMMUNITY): Payer: Self-pay | Admitting: Emergency Medicine

## 2016-08-24 DIAGNOSIS — R599 Enlarged lymph nodes, unspecified: Secondary | ICD-10-CM

## 2016-08-24 DIAGNOSIS — R591 Generalized enlarged lymph nodes: Secondary | ICD-10-CM

## 2016-08-24 NOTE — ED Provider Notes (Signed)
[facility]    CSN: [id number] Arrival date & time: 08/24/16  1843     History   Chief Complaint Chief Complaint  Patient presents with  . Sore Throat    HPI Lori Grant is a 36 y.o. female.   This is a 36 year old woman who comes to the urgent care center complaining of a swollen submandibular nodes for the last month. Patient has had some mild sinus congestion and a mild skin outbreak in the skin underneath her mandible. She's had no sore throat, night sweats, fever, weight loss, or bloody nasal discharge.  Patient also has a small node in the left epitrochlear area.      Past Medical History:  Diagnosis Date  . Homozygous for MTHFR gene mutation (HCC) 2012  . Hx of varicella   . Hypothyroidism     Patient Active Problem List   Diagnosis Date Noted  . Vaginal delivery 11/30/2014  . Knee pain 07/04/2014  . Hypothyroidism 07/04/2014  . Homozygous for MTHFR gene mutation (HCC) 07/04/2014  . Vitamin D deficiency 07/04/2014  . Obesity 07/04/2014  . Rh negative state in antepartum period 07/04/2014    Past Surgical History:  Procedure Laterality Date  . WISDOM TOOTH EXTRACTION Left 2007    OB History    Gravida Para Term Preterm AB Living   2 1 1     1    SAB TAB Ectopic Multiple Live Births         0 1       Home Medications    Prior to Admission medications   Medication Sig Start Date End Date Taking? Authorizing Provider  thyroid (ARMOUR) 30 MG tablet Take 30-60 mg by mouth 2 (two) times daily. 60mg  in the morning and 30 mg at lunch time   Yes Historical Provider, MD    Family History Family History  Problem Relation Age of Onset  . Stroke Mother   . Cancer Maternal Grandmother     colon    Social History Social History  Substance Use Topics  . Smoking status: Never Smoker  . Smokeless tobacco: Never Used  . Alcohol use No     Allergies   Patient has no known allergies.   Review of Systems Review of Systems    Constitutional: Negative.   HENT: Negative.   Gastrointestinal: Negative.      Physical Exam Triage Vital Signs ED Triage Vitals  Enc Vitals Group     BP 08/24/16 1923 108/60     Pulse Rate 08/24/16 1923 86     Resp 08/24/16 1923 16     Temp 08/24/16 1923 98.3 F (36.8 C)     Temp Source 08/24/16 1923 Oral     SpO2 08/24/16 1923 98 %     Weight --      Height --      Head Circumference --      Peak Flow --      Pain Score 08/24/16 1922 2     Pain Loc --      Pain Edu? --      Excl. in GC? --    No data found.   Updated Vital Signs BP 108/60 (BP Location: Left Arm)   Pulse 86   Temp 98.3 F (36.8 C) (Oral)   Resp 16   LMP 01/28/2016   SpO2 98%   Visual Acuity Right Eye Distance:   Left Eye Distance:   Bilateral Distance:    Right Eye Near:  Left Eye Near:    Bilateral Near:     Physical Exam  Constitutional: She is oriented to person, place, and time. She appears well-developed and well-nourished.  HENT:  Head: Normocephalic.  Right Ear: External ear normal.  Left Ear: External ear normal.  Nose: Nose normal.  Mouth/Throat: Oropharynx is clear and moist.  Eyes: Conjunctivae and EOM are normal. Pupils are equal, round, and reactive to light.  Neck: Normal range of motion. Neck supple.  Abdominal: Soft. Bowel sounds are normal. She exhibits no mass.  Musculoskeletal: Normal range of motion.  Lymphadenopathy:    She has cervical adenopathy.  Neurological: She is alert and oriented to person, place, and time.  Skin: Skin is warm and dry.  2 very small erythematous areas along the inferior surface of the jaw. Patient also has small papule in the left antecubital space.  Nursing note and vitals reviewed.    UC Treatments / Results  Labs (all labs ordered are listed, but only abnormal results are displayed) Labs Reviewed - No data to display  EKG  EKG Interpretation None       Radiology No results found.  Procedures Procedures (including  critical care time)  Medications Ordered in UC Medications - No data to display   Initial Impression / Assessment and Plan / UC Course  I have reviewed the triage vital signs and the nursing notes.  Pertinent labs & imaging results that were available during my care of the patient were reviewed by me and considered in my medical decision making (see chart for details).      Final Clinical Impressions(s) / UC Diagnoses   Final diagnoses:  Adenopathy    New Prescriptions New Prescriptions   No medications on file  I've asked the patient to get a hematology consult   Elvina SidleKurt Tawonda Legaspi, MD 08/24/16 2021

## 2016-08-24 NOTE — ED Triage Notes (Signed)
[facility] a [facility] swollen lymph nodes in her neck as well as a sore throat.

## 2016-08-24 NOTE — Discharge Instructions (Signed)
I feel reasonably certain that the swollen lymph nodes are benign and related to some mild skin irritation under your jaw. I've asked you to call the hematologist to confirm this opinion.

## 2016-09-02 ENCOUNTER — Ambulatory Visit: Payer: Self-pay

## 2016-09-02 ENCOUNTER — Ambulatory Visit: Payer: Self-pay | Admitting: *Deleted

## 2016-09-02 DIAGNOSIS — R591 Generalized enlarged lymph nodes: Secondary | ICD-10-CM | POA: Insufficient documentation

## 2016-09-05 ENCOUNTER — Other Ambulatory Visit (HOSPITAL_BASED_OUTPATIENT_CLINIC_OR_DEPARTMENT_OTHER): Payer: Self-pay

## 2016-09-05 ENCOUNTER — Ambulatory Visit: Payer: Self-pay

## 2016-09-05 ENCOUNTER — Ambulatory Visit (HOSPITAL_BASED_OUTPATIENT_CLINIC_OR_DEPARTMENT_OTHER): Payer: Self-pay | Admitting: Hematology & Oncology

## 2016-09-05 VITALS — BP 119/69 | HR 64 | Temp 98.4°F | Resp 17 | Wt 187.8 lb

## 2016-09-05 DIAGNOSIS — E7212 Methylenetetrahydrofolate reductase deficiency: Secondary | ICD-10-CM

## 2016-09-05 DIAGNOSIS — Z800 Family history of malignant neoplasm of digestive organs: Secondary | ICD-10-CM

## 2016-09-05 DIAGNOSIS — Z1589 Genetic susceptibility to other disease: Secondary | ICD-10-CM

## 2016-09-05 DIAGNOSIS — R591 Generalized enlarged lymph nodes: Secondary | ICD-10-CM

## 2016-09-05 LAB — LACTATE DEHYDROGENASE: LDH: 186 U/L (ref 125–245)

## 2016-09-05 LAB — CBC WITH DIFFERENTIAL (CANCER CENTER ONLY)
BASO#: 0 10*3/uL (ref 0.0–0.2)
BASO%: 0.3 % (ref 0.0–2.0)
EOS%: 1.3 % (ref 0.0–7.0)
Eosinophils Absolute: 0.1 10*3/uL (ref 0.0–0.5)
HCT: 40.7 % (ref 34.8–46.6)
HGB: 14.5 g/dL (ref 11.6–15.9)
LYMPH#: 2 10*3/uL (ref 0.9–3.3)
LYMPH%: 29.2 % (ref 14.0–48.0)
MCH: 30 pg (ref 26.0–34.0)
MCHC: 35.6 g/dL (ref 32.0–36.0)
MCV: 84 fL (ref 81–101)
MONO#: 0.5 10*3/uL (ref 0.1–0.9)
MONO%: 7 % (ref 0.0–13.0)
NEUT#: 4.2 10*3/uL (ref 1.5–6.5)
NEUT%: 62.2 % (ref 39.6–80.0)
PLATELETS: 245 10*3/uL (ref 145–400)
RBC: 4.83 10*6/uL (ref 3.70–5.32)
RDW: 14.4 % (ref 11.1–15.7)
WBC: 6.8 10*3/uL (ref 3.9–10.0)

## 2016-09-05 NOTE — Progress Notes (Signed)
Referral MD  Reason for Referral: Submental lymphadenopathy - possible   Chief Complaint  Patient presents with  . Establish Care  : I have some swollen lymph nodes.  HPI: Lori Grant is a very nice 36 year old white female. She has been good health. She has a 25 month old son. She had no problems with the pregnancy. She had him fullterm and he weighed 9 pounds.  She did have a miscarriage after her first pregnancy. Surprisingly enough, she is homozygous for the MTHFR mutation. His heart is a home much of this was a factor. Impression does have hypothyroidism.  She is worried about some swelling under her chin. She went to urgent care. She did not have any x-rays done. However, it was felt that there was some swollen nodes under the chin.  She was then referred to the [facility] for an evaluation.  She has not noted any lymph nodes elsewhere. She's had no rashes. She's had no weight loss or weight gain. She's had no obvious occupational exposures. She does not smoke. She does not drink.  There really is no family history of malignancy.  There is also some question of a possible lymph node in the left epitrochlear area.  Again, no scans have been done today.  She has been tested for hepatitis. She tested for HIV. Everything is negative.  She really has not had any past surgery. There's no change in her monthly cycles. She's had no change in bowel or bladder habits. She's not noted any cough or shortness of breath. She's had no hoarseness. She's had no dysphagia or odynophagia.  Overall, her performance status is ECOG 0.     Past Medical History:  Diagnosis Date  . Homozygous for MTHFR gene mutation (HCC) 2012  . Hx of varicella   . Hypothyroidism   :  Past Surgical History:  Procedure Laterality Date  . WISDOM TOOTH EXTRACTION Left 2007  :   Current Outpatient Prescriptions:  .  thyroid (ARMOUR) 30 MG tablet, Take 30-60 mg by mouth 2 (two) times  daily. 60mg  in the morning and 30 mg at lunch time, Disp: , Rfl: :  :  No Known Allergies:  Family History  Problem Relation Age of Onset  . Stroke Mother   . Cancer Maternal Grandmother     colon  :  Social History   Social History  . Marital status: Married    Spouse name: N/A  . Number of children: N/A  . Years of education: N/A   Occupational History  . Not on file.   Social History Main Topics  . Smoking status: Never Smoker  . Smokeless tobacco: Never Used  . Alcohol use No  . Drug use: No  . Sexual activity: Yes    Birth control/ protection: None, Condom   Other Topics Concern  . Not on file   Social History Narrative  . No narrative on file  :  Pertinent items are noted in HPI.  Exam:Well-developed and well-nourished white female in no obvious distress. Vital signs are temperature of 98.4. Pulse 64. Blood pressure 119/69. Weight is 187 pounds. Head and neck exam shows no ocular or oral lesions. She has no obvious adenopathy in the neck. There may be a possible cyst under the chin. I really cannot palpate any lymph nodes under the chin. There is no cervical or supraclavicular lymph nodes. Thyroid is nonpalpable. Lungs are clear. Cardiac exam regular rate and rhythm with no murmurs, rubs or  bruits. Axillary exam shows no bilateral axillary adenopathy. Abdomen is soft. She is mildly obese. She has no fluid wave. There is no palpable liver or spleen tip. There is no inguinal adenopathy bilaterally. Back exam shows no tenderness over the spine, ribs or hips. Extremities shows no clubbing, cyanosis or edema. I'm really not impressed with any lymph nodes in the epitrochlear region. Skin exam shows no rashes, ecchymoses or petechia.              Recent Labs  09/05/16 1322  WBC 6.8  HGB 14.5  HCT 40.7  PLT 245   No results for input(s): NA, K, CL, CO2, GLUCOSE, BUN, CREATININE, CALCIUM in the last 72 hours.  Blood smear review:  Normochromic and  normocytic population of red blood cells. She has no nucleated red blood cells. She has no teardrop cells. White cells appear normal in morphology and maturation. There is no atypical lymphocytes. She has no hypersegmented polys. Platelets are adequate in number and size.  Pathology: None     Assessment and Plan:  Lori Grant is a 36 year old white female. She, in my mind caloric does not have any obvious adenopathy. Is possible she may have some cysts under the chin. I really cannot palpate any pathologic lymph nodes. I really don't think she has any lymph nodes in the left epitrochlear space.  I don't see that we have to do any biopsies. I do not see that we had to do any scans on her.  I'm more interested in the fact that she has had the miscarriage. She does have the MTHFR mutation. I much or if this really would be related to her miscarriage. I note that some studies seem to suggest this.   For now, I just don't think we had to see her back in the office. My sure that we really are adding to her medical care. If I told her, that if she does note any lymph nodes that are more swollen, to let us know. I told her that if she has another miscarriage to let us know. I also told her that she is pregnant, to let us know.  She is very interesting. It was nice talking to her. I suspect that she will be the healthiest patient that I see all week.  I spent about 45 minutes with her.

## 2016-09-06 LAB — COMPREHENSIVE METABOLIC PANEL (CC13)
ALK PHOS: 81 IU/L (ref 39–117)
ALT: 16 IU/L (ref 0–32)
AST: 18 IU/L (ref 0–40)
Albumin, Serum: 4.5 g/dL (ref 3.5–5.5)
Albumin/Globulin Ratio: 1.6 (ref 1.2–2.2)
BUN/Creatinine Ratio: 14 (ref 9–23)
BUN: 11 mg/dL (ref 6–20)
Bilirubin Total: 0.5 mg/dL (ref 0.0–1.2)
CO2: 21 mmol/L (ref 18–29)
CREATININE: 0.8 mg/dL (ref 0.57–1.00)
Calcium, Ser: 10 mg/dL (ref 8.7–10.2)
Chloride, Ser: 102 mmol/L (ref 96–106)
GFR calc Af Amer: 112 mL/min/{1.73_m2} (ref >59)
GFR calc non Af Amer: 97 mL/min/{1.73_m2} (ref >59)
GLUCOSE: 92 mg/dL (ref 65–99)
Globulin, Total: 2.9 g/dL (ref 1.5–4.5)
Potassium, Ser: 4.1 mmol/L (ref 3.5–5.2)
Sodium: 139 mmol/L (ref 134–144)
Total Protein: 7.4 g/dL (ref 6.0–8.5)

## 2016-09-06 LAB — BETA 2 MICROGLOBULIN, SERUM: Beta-2: 1 mg/L (ref 0.6–2.4)

## 2016-10-17 ENCOUNTER — Other Ambulatory Visit (HOSPITAL_COMMUNITY): Payer: Self-pay | Admitting: *Deleted

## 2016-10-17 DIAGNOSIS — N6310 Unspecified lump in the right breast, unspecified quadrant: Secondary | ICD-10-CM

## 2016-10-27 ENCOUNTER — Ambulatory Visit
Admission: RE | Admit: 2016-10-27 | Discharge: 2016-10-27 | Disposition: A | Discharge status: Home / Self Care | Payer: Self-pay | Source: Ambulatory Visit | Attending: Obstetrics and Gynecology | Admitting: Obstetrics and Gynecology

## 2016-10-27 ENCOUNTER — Ambulatory Visit
Admission: RE | Admit: 2016-10-27 | Discharge: 2016-10-27 | Disposition: A | Discharge status: Home / Self Care | Payer: No Typology Code available for payment source | Source: Ambulatory Visit | Attending: Obstetrics and Gynecology | Admitting: Obstetrics and Gynecology

## 2016-10-27 ENCOUNTER — Ambulatory Visit (HOSPITAL_COMMUNITY)
Admission: RE | Admit: 2016-10-27 | Discharge: 2016-10-27 | Disposition: A | Discharge status: Home / Self Care | Payer: Self-pay | Source: Ambulatory Visit | Attending: Obstetrics and Gynecology | Admitting: Obstetrics and Gynecology

## 2016-10-27 ENCOUNTER — Encounter (HOSPITAL_COMMUNITY): Payer: Self-pay

## 2016-10-27 VITALS — BP 112/70 | Temp 97.8°F | Ht 63.0 in | Wt 184.2 lb

## 2016-10-27 DIAGNOSIS — N6310 Unspecified lump in the right breast, unspecified quadrant: Secondary | ICD-10-CM

## 2016-10-27 DIAGNOSIS — Z1239 Encounter for other screening for malignant neoplasm of breast: Secondary | ICD-10-CM

## 2016-10-27 NOTE — Progress Notes (Signed)
Complaints or reddened area on right breast since in December after mammogram. Patient complained of some right breast soreness that comes and goes. Patient rated the pain at a 1-2 out of 10.  Pap Smear: Pap smear not completed today. Last Pap smear was 08/09/2016 in B[facility]CP clinic and normal with negative HPV. Per patient has no history of an abnormal Pap smear. Last Pap smear result is in EPIC.  Physical exam: Breasts Breasts symmetrical. No skin abnormalities left breast. Observed a reddened right breast bump that appears to have healed on the right breast at 9 o'clock 4 cm from the nipple. No nipple retraction bilateral breasts. No nipple discharge bilateral breasts. No lymphadenopathy. No lumps palpated bilateral breasts. No complaints of pain or tenderness on exam. Referred patient to the [facility] for diagnostic mammogram and possible right breast ultrasound. Appointment scheduled for Thursday, April , 2016 at 1000.        Pelvic/Bimanual No Pap smear completed today since last Pap smear and HPV typing was 08/09/2016. Pap smear not indicated per BCCCP guidelines.   Smoking History: Patient has never smoked.  Patient Navigation: Patient education provided. Access to services provided for patient through [facility] program.

## 2016-10-27 NOTE — Patient Instructions (Signed)
Explained breast self awareness with Demitria Hay. Patient did not need a Pap smear today due to last Pap smear and HPV typing was 08/09/2016. Let her know BCCCP will cover Pap smears and HPV typing every 5 years unless has a history of abnormal Pap smears. Referred patient to the [facility] for diagnostic mammogram and possible right breast ultrasound. Appointment scheduled for Thursday, April , 2016 at 1000. Angelik Walls verbalized understanding.  Brannock, Kathaleen Maser, RN 11:15 AM

## 2016-11-18 ENCOUNTER — Other Ambulatory Visit: Payer: Self-pay | Admitting: Family Medicine

## 2016-11-18 DIAGNOSIS — E039 Hypothyroidism, unspecified: Secondary | ICD-10-CM

## 2016-11-28 ENCOUNTER — Ambulatory Visit
Admission: RE | Admit: 2016-11-28 | Discharge: 2016-11-28 | Disposition: A | Discharge status: Home / Self Care | Payer: No Typology Code available for payment source | Source: Ambulatory Visit | Attending: Family Medicine | Admitting: Family Medicine

## 2016-11-28 DIAGNOSIS — E039 Hypothyroidism, unspecified: Secondary | ICD-10-CM

## 2017-01-03 ENCOUNTER — Ambulatory Visit (INDEPENDENT_AMBULATORY_CARE_PROVIDER_SITE_OTHER): Payer: Self-pay

## 2017-01-03 ENCOUNTER — Encounter: Payer: Self-pay | Admitting: Obstetrics and Gynecology

## 2017-01-03 DIAGNOSIS — Z3201 Encounter for pregnancy test, result positive: Secondary | ICD-10-CM

## 2017-01-03 LAB — POCT PREGNANCY, URINE: PREG TEST UR: POSITIVE — AB

## 2017-01-03 NOTE — Progress Notes (Signed)
Patient presented to the office today for a pregnancy test. Test does confirm she is pregnant around 5 weeks according to her last period. Patient was given a letter of verification to apply for medicaid. Medication have been reviewed at this time. Patient advised to start prenatal vitamins.

## 2017-01-19 ENCOUNTER — Other Ambulatory Visit: Payer: Self-pay | Admitting: *Deleted

## 2017-01-20 ENCOUNTER — Ambulatory Visit (HOSPITAL_BASED_OUTPATIENT_CLINIC_OR_DEPARTMENT_OTHER): Payer: Self-pay | Admitting: Hematology & Oncology

## 2017-01-20 ENCOUNTER — Other Ambulatory Visit (HOSPITAL_BASED_OUTPATIENT_CLINIC_OR_DEPARTMENT_OTHER): Payer: Medicaid Other

## 2017-01-20 VITALS — BP 112/65 | HR 87 | Temp 97.6°F | Resp 16 | Wt 183.0 lb

## 2017-01-20 DIAGNOSIS — Z1589 Genetic susceptibility to other disease: Secondary | ICD-10-CM

## 2017-01-20 DIAGNOSIS — E7212 Methylenetetrahydrofolate reductase deficiency: Secondary | ICD-10-CM

## 2017-01-20 DIAGNOSIS — N96 Recurrent pregnancy loss: Secondary | ICD-10-CM

## 2017-01-20 LAB — CMP (CANCER CENTER ONLY)
ALT(SGPT): 22 U/L (ref 10–47)
AST: 23 U/L (ref 11–38)
Albumin: 3.6 g/dL (ref 3.3–5.5)
Alkaline Phosphatase: 82 U/L (ref 26–84)
BILIRUBIN TOTAL: 0.8 mg/dL (ref 0.20–1.60)
BUN, Bld: 9 mg/dL (ref 7–22)
CALCIUM: 9.5 mg/dL (ref 8.0–10.3)
CO2: 28 meq/L (ref 18–33)
Chloride: 108 mEq/L (ref 98–108)
Creat: 0.6 mg/dl (ref 0.6–1.2)
GLUCOSE: 93 mg/dL (ref 73–118)
POTASSIUM: 3.9 meq/L (ref 3.3–4.7)
Sodium: 141 mEq/L (ref 128–145)
Total Protein: 7 g/dL (ref 6.4–8.1)

## 2017-01-20 LAB — CBC WITH DIFFERENTIAL (CANCER CENTER ONLY)
BASO#: 0 10*3/uL (ref 0.0–0.2)
BASO%: 0.2 % (ref 0.0–2.0)
EOS%: 1.8 % (ref 0.0–7.0)
Eosinophils Absolute: 0.1 10*3/uL (ref 0.0–0.5)
HEMATOCRIT: 39.6 % (ref 34.8–46.6)
HGB: 14 g/dL (ref 11.6–15.9)
LYMPH#: 1.7 10*3/uL (ref 0.9–3.3)
LYMPH%: 30.6 % (ref 14.0–48.0)
MCH: 30.1 pg (ref 26.0–34.0)
MCHC: 35.4 g/dL (ref 32.0–36.0)
MCV: 85 fL (ref 81–101)
MONO#: 0.4 10*3/uL (ref 0.1–0.9)
MONO%: 6.4 % (ref 0.0–13.0)
NEUT#: 3.3 10*3/uL (ref 1.5–6.5)
NEUT%: 61 % (ref 39.6–80.0)
Platelets: 251 10*3/uL (ref 145–400)
RBC: 4.65 10*6/uL (ref 3.70–5.32)
RDW: 12.6 % (ref 11.1–15.7)
WBC: 5.4 10*3/uL (ref 3.9–10.0)

## 2017-01-20 MED ORDER — FOLIC ACID 1 MG PO TABS: 1.0000 mg | ORAL_TABLET | Freq: Every day | ORAL | 12 refills | Status: DC

## 2017-01-20 MED ORDER — AMOXICILLIN-POT CLAVULANATE 875-125 MG PO TABS: 1.0000 | ORAL_TABLET | Freq: Two times a day (BID) | ORAL | 0 refills | Status: DC

## 2017-01-21 LAB — LUPUS ANTICOAGULANT PANEL
PTT-LA: 36.2 s (ref 0.0–51.9)
dRVVT: 37.1 s (ref 0.0–47.0)

## 2017-01-21 LAB — PROTEIN C ACTIVITY: Protein C-Functional: 118 % (ref 73–180)

## 2017-01-21 LAB — ANTITHROMBIN III: Antithrombin Activity: 57 % — ABNORMAL LOW (ref 75–135)

## 2017-01-21 LAB — PROTEIN S, TOTAL: Protein S, Total: 99 % (ref 60–150)

## 2017-01-21 LAB — PROTEIN S ACTIVITY: Protein S-Functional: 96 % (ref 63–140)

## 2017-01-23 LAB — BETA-2-GLYCOPROTEIN I ABS, IGG/M/A

## 2017-01-24 LAB — CARDIOLIPIN ANTIBODIES, IGG, IGM, IGA
Anticardiolipin Ab,IgG,Qn: <9 GPL U/mL (ref 0–14)
Anticardiolipin Ab,IgM,Qn: <9 MPL U/mL (ref 0–12)

## 2017-01-24 LAB — PROTEIN C, TOTAL: Protein C Antigen: 102 % (ref 60–150)

## 2017-01-24 LAB — FACTOR 5 LEIDEN

## 2017-01-24 NOTE — Progress Notes (Signed)
Hematology and Oncology Follow Up Visit  Lori Grant [id number] 16-Dec-1980 36 y.o. 01/24/2017   Principle Diagnosis:   Recurrent miscarriage  MTHFR mutation - homozygous  Current Therapy:    Folic acid 1 mg by mouth daily     Interim History:  [name] is back for a follow-up. Unfortunately, she had a miscarriage. She was early on in her pregnancy. I think she probably was 4 or 5 weeks into her pregnancy.  She has had miscarriages in the past.  She does have a son. He is 34 years old. She was able to carry him to full-term.  We did go ahead and do some hypercoagulable studies. So far, her studies have all been unremarkable.  She is taking some multivitamins. I think she is on a prenatal type vitamin.  She has had no bleeding. She's had no leg swelling. She's had no swollen lymph nodes.  Her appetite is doing okay. She's had no nausea or vomiting.  Overall, her performance status is ECOG 0. Medications:  Current Outpatient Prescriptions:  .  Cobalamine Combinations (VITAMIN B12-FOLIC ACID PO), Take by mouth., Disp: , Rfl:  .  amoxicillin-clavulanate (AUGMENTIN) 875-125 MG tablet, Take 1 tablet by mouth 2 (two) times daily., Disp: 10 tablet, Rfl: 0 .  folic acid (FOLVITE) 1 MG tablet, Take 1 tablet (1 mg total) by mouth daily., Disp: 30 tablet, Rfl: 12 .  Prenatal Vit-Fe Fumarate-FA (PRENATAL 1+1 PO), Prenatal, Disp: , Rfl:  .  thyroid (ARMOUR) 30 MG tablet, Take 30-60 mg by mouth 2 (two) times daily. 60mg  in the morning and 30 mg at lunch time, Disp: , Rfl:   Allergies: No Known Allergies  Past Medical History, Surgical history, Social history, and Family History were reviewed and updated.  Review of Systems:  As above  Physical Exam:  weight is 183 lb (83 kg). Her oral temperature is 97.6 F (36.4 C). Her blood pressure is 112/65 and her pulse is 87. Her respiration is 16 and oxygen saturation is 100%.   Wt Readings from Last 3 Encounters:  01/20/17 183 lb  (83 kg)  10/27/16 184 lb 3.2 oz (83.6 kg)  09/05/16 187 lb 12 oz (85.2 kg)     Head and neck exam shows no ocular or oral lesions. She has no obvious adenopathy in the neck. There may be a possible cyst under the chin. I really cannot palpate any lymph nodes under the chin. There is no cervical or supraclavicular lymph nodes. Thyroid is nonpalpable. Lungs are clear. Cardiac exam regular rate and rhythm with no murmurs, rubs or bruits. Axillary exam shows no bilateral axillary adenopathy. Abdomen is soft. She is mildly obese. She has no fluid wave. There is no palpable liver or spleen tip. There is no inguinal adenopathy bilaterally. Back exam shows no tenderness over the spine, ribs or hips. Extremities shows no clubbing, cyanosis or edema. I'm really not impressed with any lymph nodes in the epitrochlear region. Skin exam shows no rashes, ecchymoses or petechia  Lab Results  Component Value Date   WBC 5.4 01/20/2017   HGB 14.0 01/20/2017   HCT 39.6 01/20/2017   MCV 85 01/20/2017   PLT 251 01/20/2017     Chemistry      Component Value Date/Time   NA 141 01/20/2017 1025   K 3.9 01/20/2017 1025   CL 108 01/20/2017 1025   CO2 28 01/20/2017 1025   BUN 9 01/20/2017 1025   CREATININE 0.6 01/20/2017 1025  Component Value Date/Time   CALCIUM 9.5 01/20/2017 1025   ALKPHOS 82 01/20/2017 1025   AST 23 01/20/2017 1025   ALT 22 01/20/2017 1025   BILITOT 0.80 01/20/2017 1025         Impression and Plan: [name] is a 36 year old white female. She is homozygous for the MTHFR mutation. I have to believe that this is a factor.  Unfortunately, I think that if she does have another pregnancy, that she will have to be on anticoagulation. I'll have to get her on Lovenox. I probably would get her on therapeutic Lovenox.   She will let me know in the future when she finds that she is pregnant. Once we know that she is pregnant, then she will come to her office immediately and we can start  her on anticoagulation therapy to try to get her through her pregnancy.   We will see what the remainder of her hypercoagulable studies show.   She is incredibly nice. I just feel bad for her that she have this miscarriage.   Hopefully, we will see her again within the year.  I spent about 40 minutes with her today.      Josph Macho, MD 7/10/20189:21 AM

## 2017-01-25 LAB — PROTHROMBIN GENE MUTATION

## 2017-07-21 ENCOUNTER — Encounter (HOSPITAL_COMMUNITY): Payer: Self-pay

## 2017-07-21 ENCOUNTER — Ambulatory Visit (HOSPITAL_COMMUNITY): Payer: Self-pay

## 2017-07-21 NOTE — Progress Notes (Deleted)
Patient brought signed authorization for use/disclosure of protected health information form to office. Gave patient a printed copy of PAP results from 08/09/2016 [facility] appointment.

## 2017-08-31 IMAGING — US US OB TRANSVAGINAL
1 series · 15 of 28 positions shown · non-contrast
Comparison: None.

CLINICAL DATA: Acute onset of vaginal bleeding.  Initial encounter.

EXAM:
OBSTETRIC <14 WK US AND TRANSVAGINAL OB US
TECHNIQUE: Both transabdominal and transvaginal ultrasound examinations were
performed for complete evaluation of the gestation as well as the
maternal uterus, adnexal regions, and pelvic cul-de-sac.
Transvaginal technique was performed to assess early pregnancy.

[Series 1: us ob transvaginal · 15 of 64 slices shown]
[im 1/64]
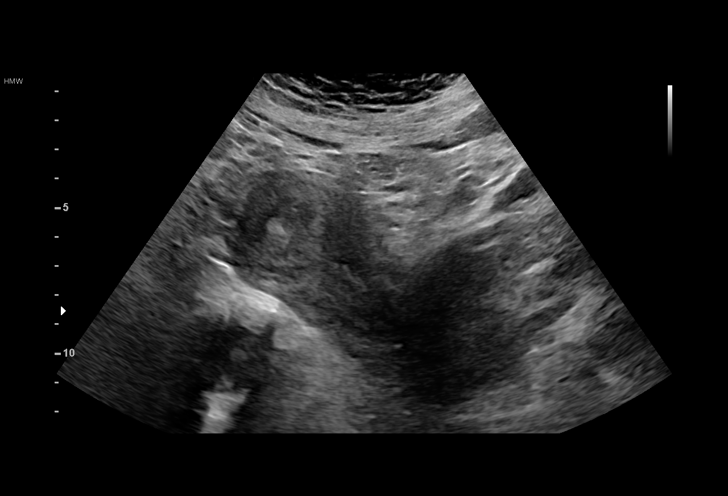
[im 5/64]
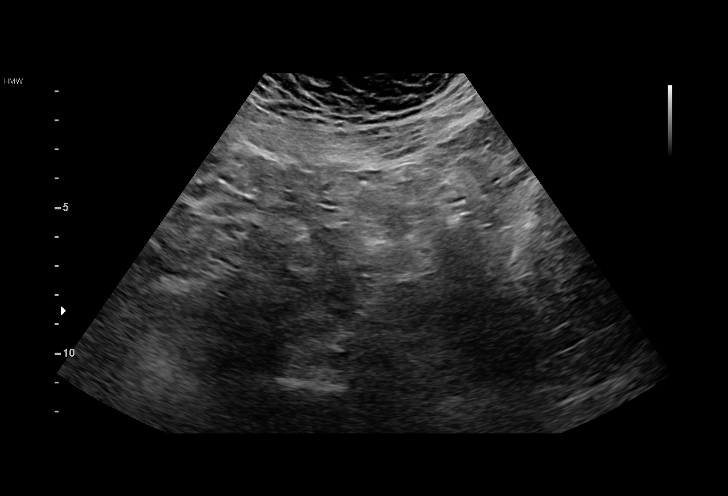
[im 10/64]
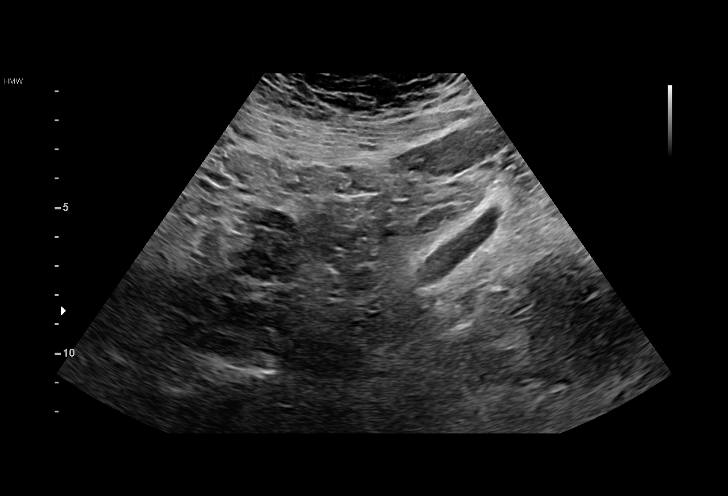
[im 15/64]
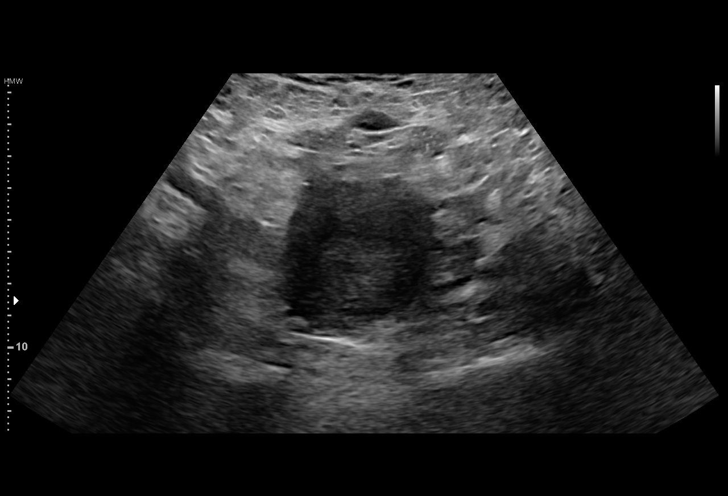
[im 19/64]
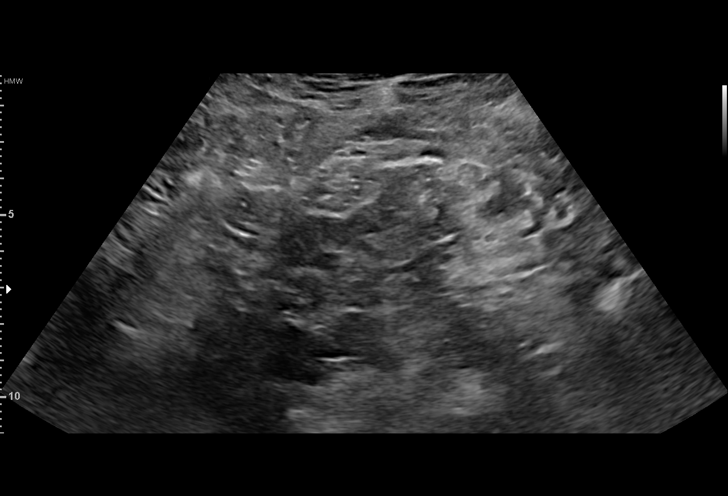
[im 24/64]
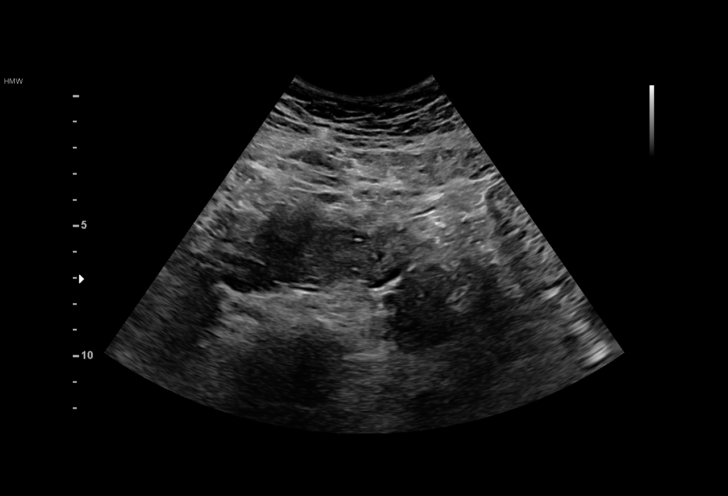
[im 29/64]
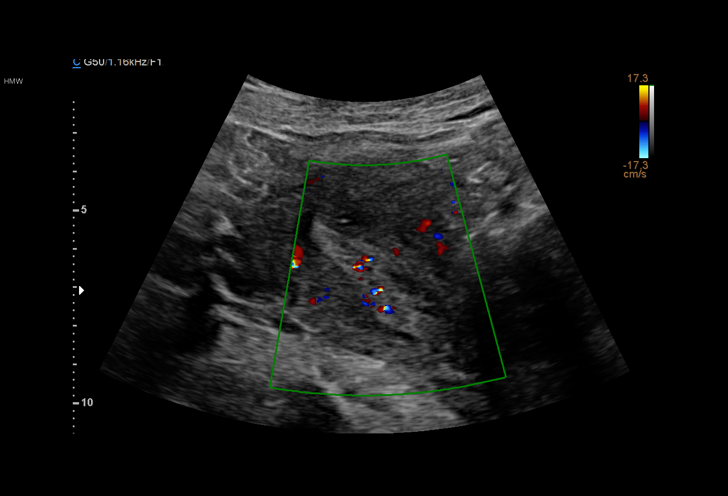
[im 33/64]
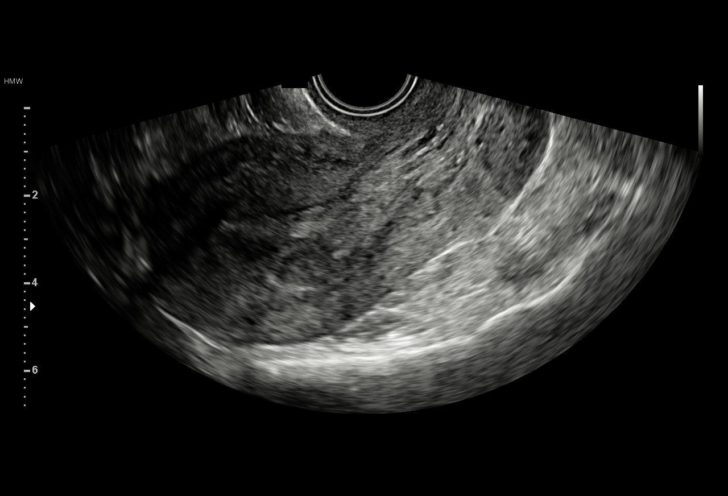
[im 36/64]
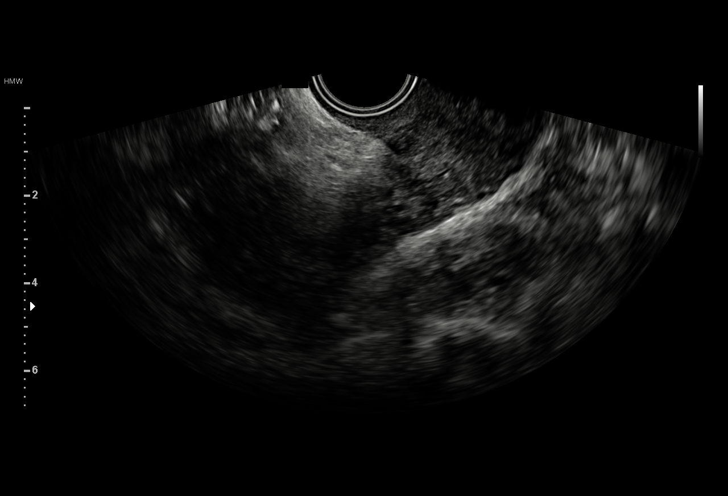
[im 40/64]
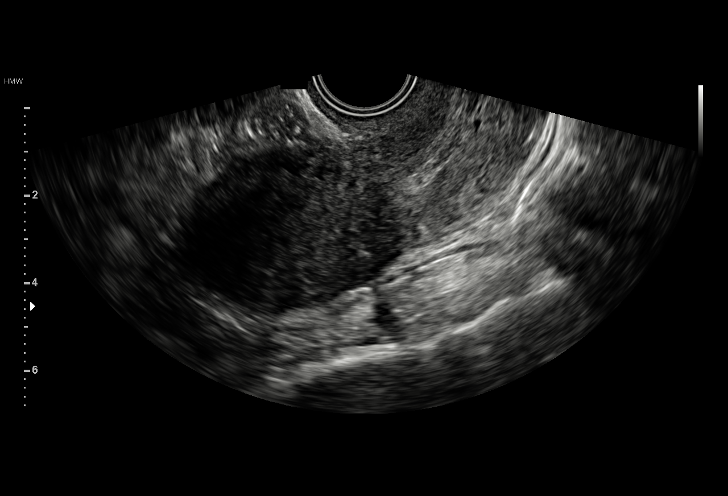
[im 45/64]
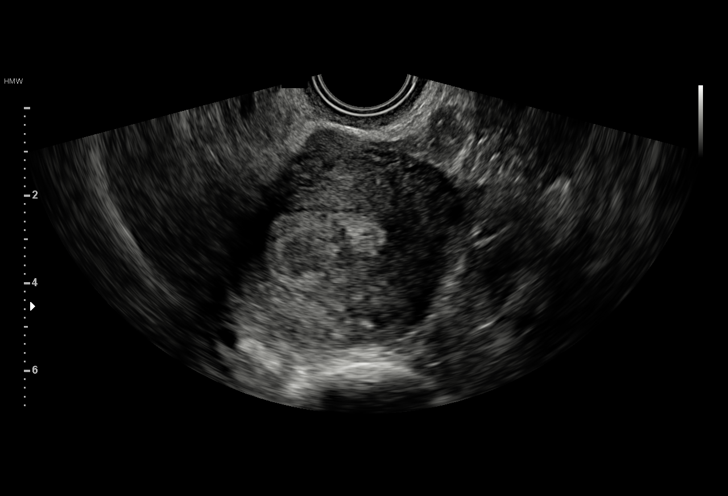
[im 50/64]
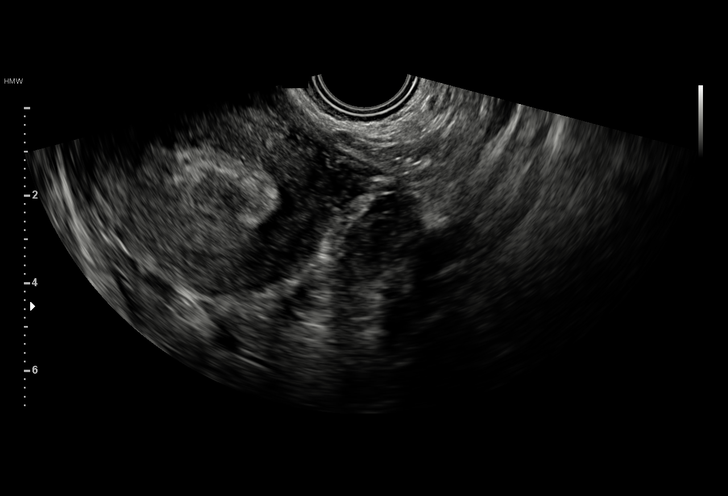
[im 54/64]
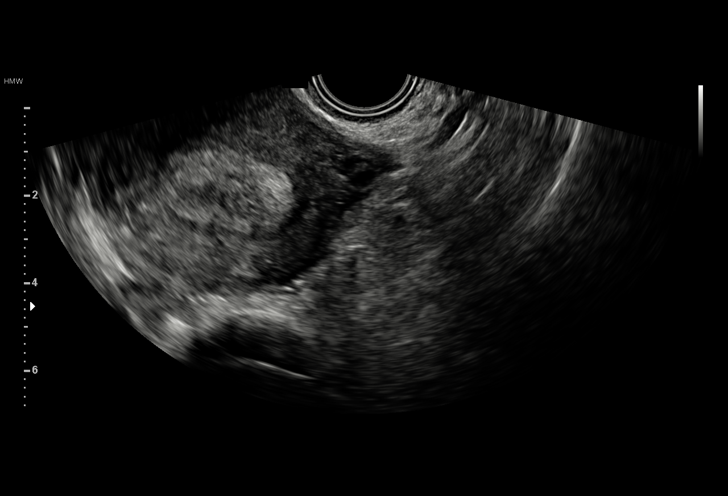
[im 59/64]
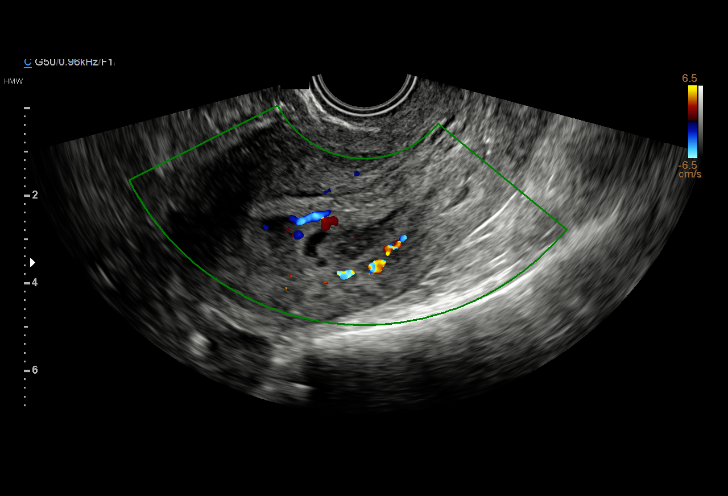
[im 64/64]
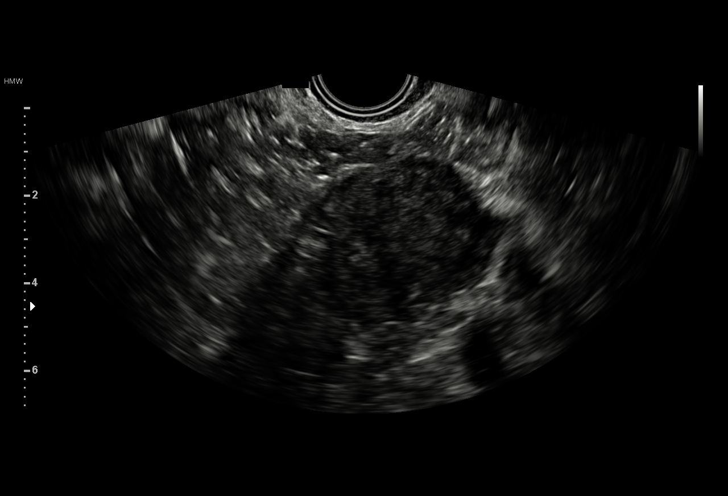

[15 of 28 positions shown; findings below may reference images not displayed]

FINDINGS: Intrauterine gestational sac: None seen.

Yolk sac:  N/A

Embryo:  N/A

Subchorionic hemorrhage:  None visualized.

Maternal uterus/adnexae: The endometrial echo complex is thickened,
measuring 2.0 cm, with mildly increased blood flow. The uterus is
unremarkable in appearance.

The right ovary is not visualized. The left ovary is unremarkable in
appearance, measuring 2.5 x 1.4 x 1.9 cm. No suspicious adnexal
masses are seen; there is no evidence for ovarian torsion.

No free fluid is seen within the pelvic cul-de-sac.
IMPRESSION: No intrauterine gestational sac seen at this time. No evidence for
ectopic pregnancy. Would correlate with the patient's quantitative
beta HCG level. If it continues to trend upward, would perform
follow-up pelvic ultrasound in 2 weeks for further evaluation.

## 2017-09-08 IMAGING — US US THYROID
1 series · 14 of 25 positions shown · non-contrast
Comparison: 07/25/2014

CLINICAL DATA: Hypothyroidism, dysphagia, lymphadenopathy suspected
on physical exam

EXAM:
THYROID ULTRASOUND
TECHNIQUE: Ultrasound examination of the thyroid gland and adjacent soft
tissues was performed.

[Series 1: us thyroid · 0.05mm/px · 14 of 37 slices shown]
[im 1/37]
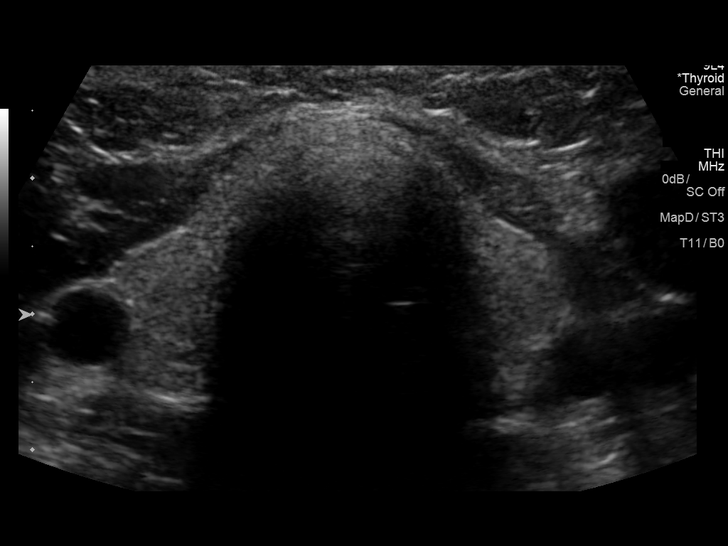
[im 4/37]
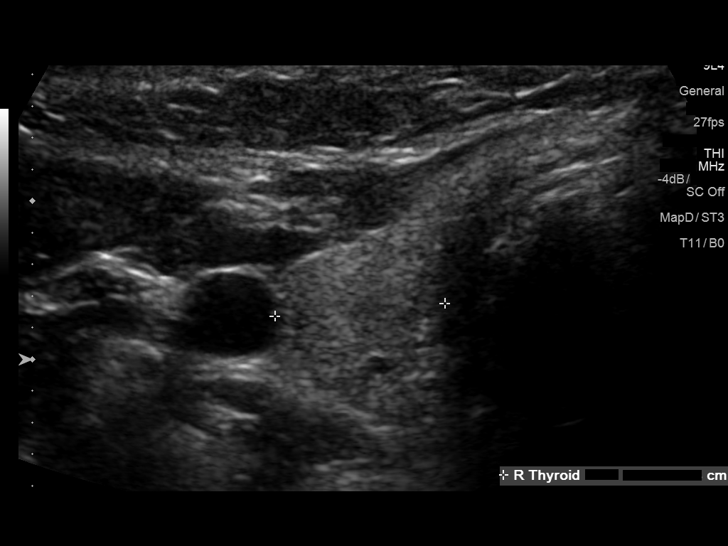
[im 7/37]
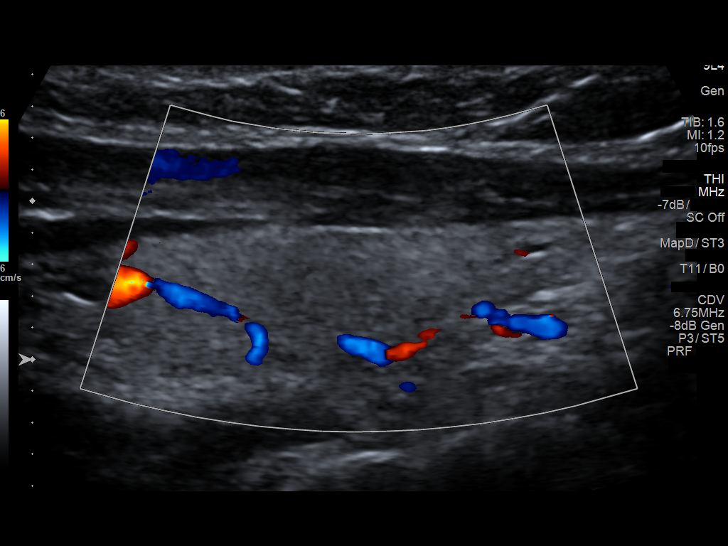
[im 10/37]
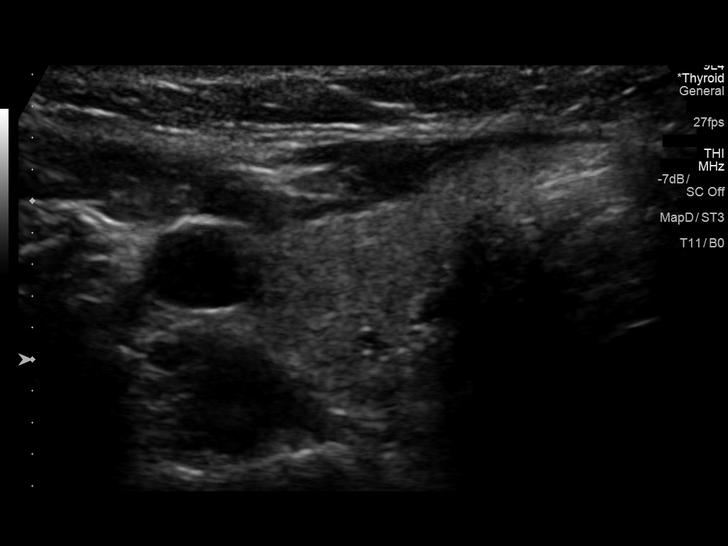
[im 13/37]
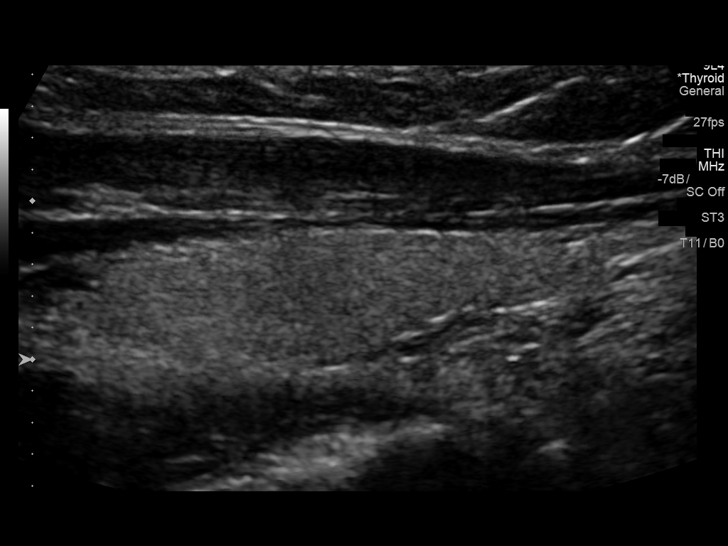
[im 14/37]
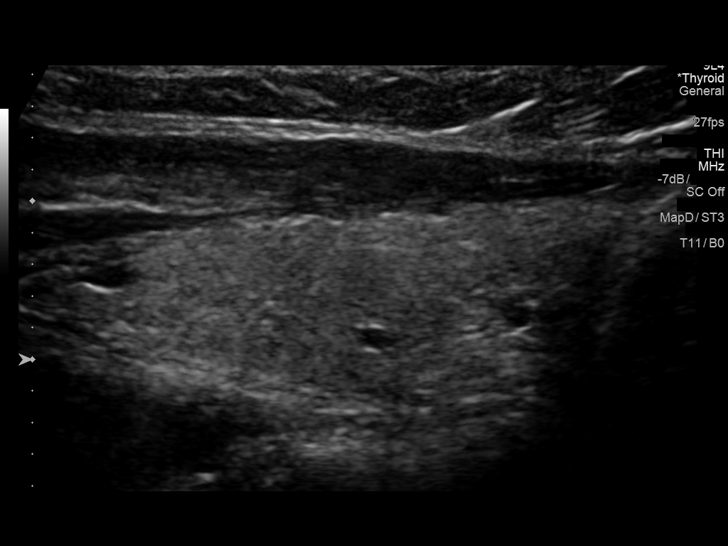
[im 17/37]
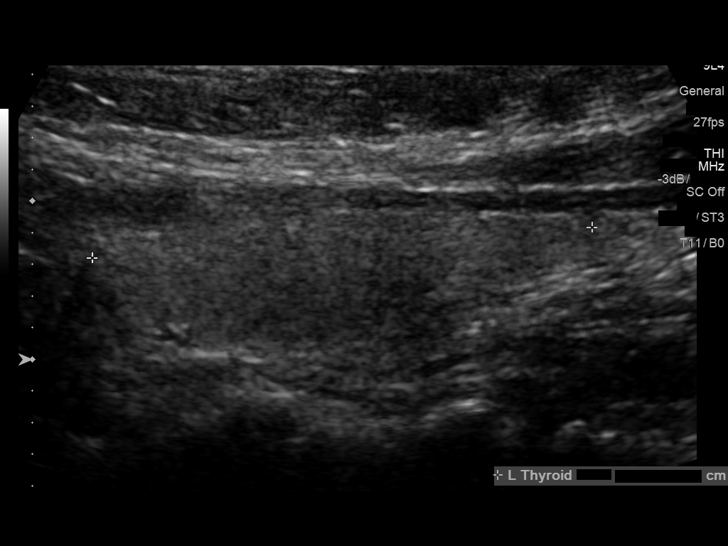
[im 20/37]
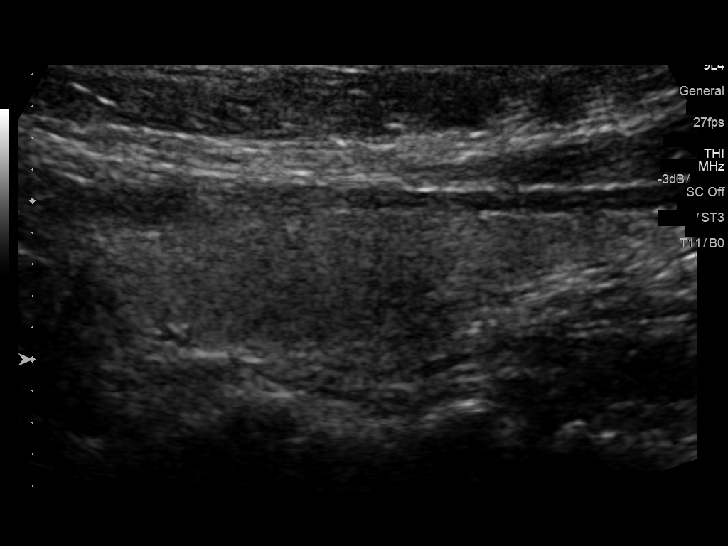
[im 23/37]
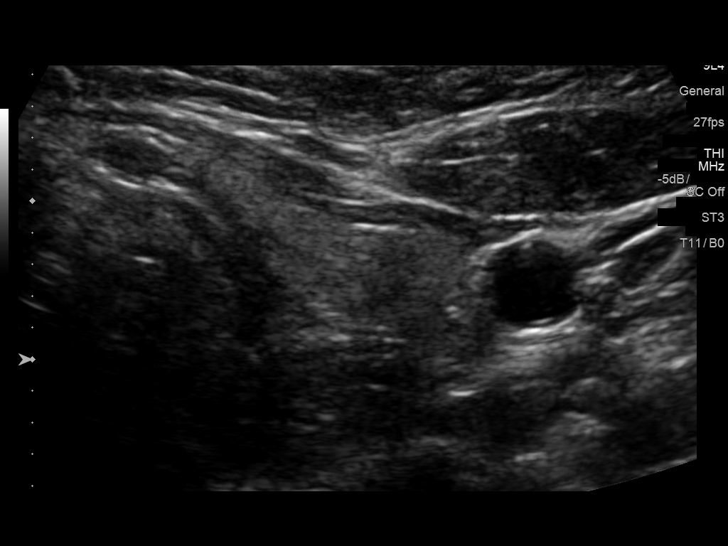
[im 25/37]
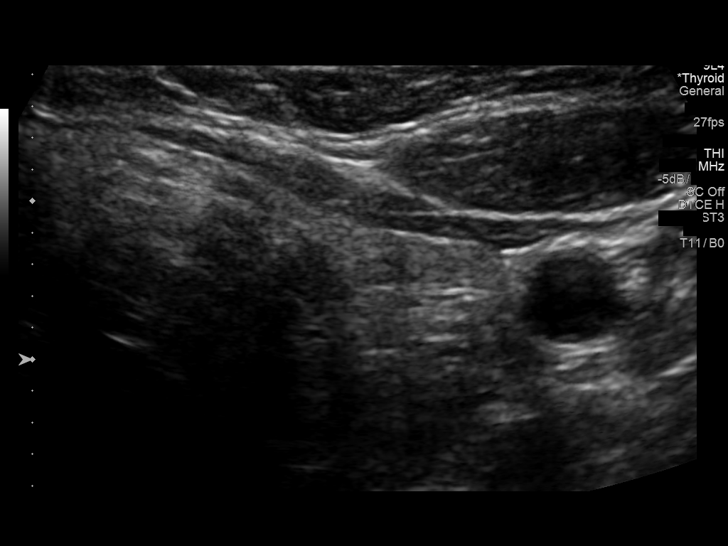
[im 28/37]
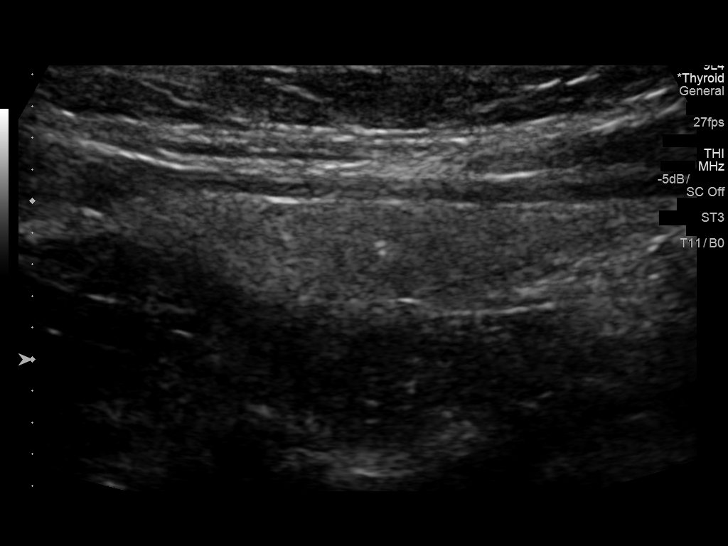
[im 31/37]
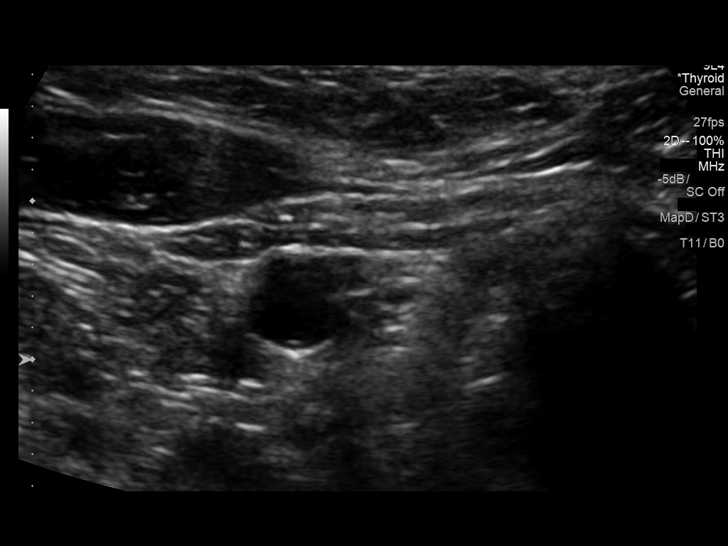
[im 34/37]
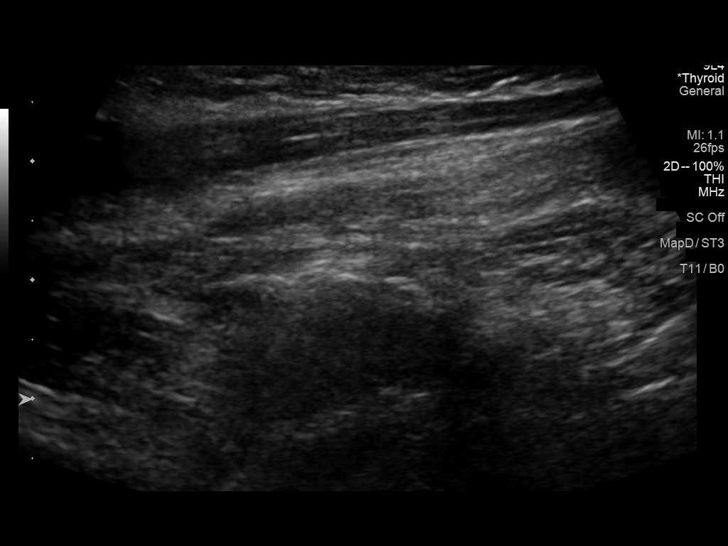
[im 37/37]
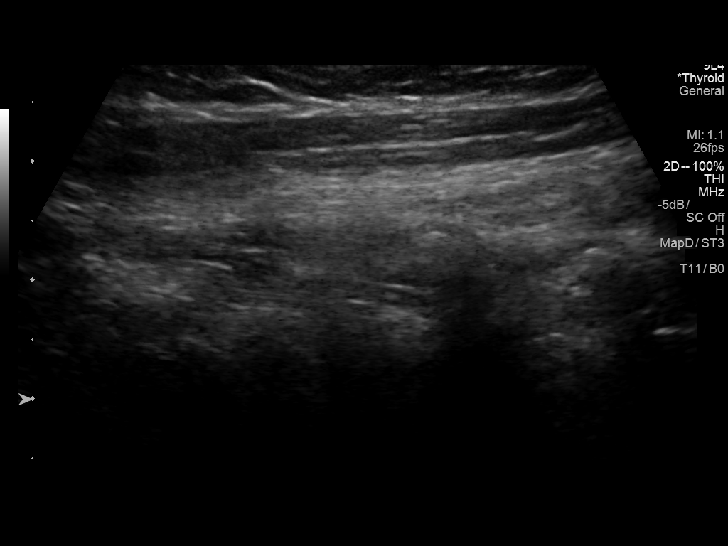

[14 of 25 positions shown; findings below may reference images not displayed]

FINDINGS: Parenchymal Echotexture: Mildly heterogenous

Isthmus: 0.2 cm thickness, stable

Right lobe: 3.3 x 0.9 x 1.1 cm, previously 3.5 x 1.1 x

Left lobe: 3.2 x 1 x 1.1 cm, previously 3.9 x 0.9 x

_________________________________________________________

Estimated total number of nodules >/= 1 cm: 0

Number of spongiform nodules >/=  2 cm not described below (TR1): 0

Number of mixed cystic and solid nodules >/= 1.5 cm not described
below (TR2): 0

_________________________________________________________

No discrete nodules are seen within the thyroid gland. No adenopathy
localized on limited survey cervical interrogation.
IMPRESSION: Normal thyroid.  No adenopathy localized.

The above is in keeping with the ACR TI-RADS recommendations - [HOSPITAL] 9276;[DATE].

## 2018-08-01 ENCOUNTER — Encounter: Payer: Self-pay | Admitting: Obstetrics and Gynecology

## 2018-08-01 ENCOUNTER — Ambulatory Visit (INDEPENDENT_AMBULATORY_CARE_PROVIDER_SITE_OTHER): Payer: Self-pay | Admitting: Obstetrics and Gynecology

## 2018-08-01 VITALS — BP 108/73 | HR 95 | Wt 211.8 lb

## 2018-08-01 DIAGNOSIS — E039 Hypothyroidism, unspecified: Secondary | ICD-10-CM

## 2018-08-01 DIAGNOSIS — R140 Abdominal distension (gaseous): Secondary | ICD-10-CM

## 2018-08-01 DIAGNOSIS — N912 Amenorrhea, unspecified: Secondary | ICD-10-CM | POA: Insufficient documentation

## 2018-08-01 LAB — POCT PREGNANCY, URINE: Preg Test, Ur: NEGATIVE

## 2018-08-01 MED ORDER — PSYLLIUM 58.6 % PO POWD: 1.0000 | Freq: Every day | ORAL | 12 refills | Status: DC

## 2018-08-01 NOTE — Progress Notes (Signed)
5 months PP no cycle, Condoms for BC.

## 2018-08-01 NOTE — Progress Notes (Signed)
q3-4h Condoms Felt like a period few times in 2798m but nothing Last week home upt Occasional low belly low cramps b/l sharp Bloating Loose stools/constipation Obstetrics and Gynecology Established Patient Evaluation  Appointment Date: 08/01/2018  [facility]  Primary Care Provider: Patient, No Pcp Per  Referring Provider: No ref. provider found  Chief Complaint: amenorrhea, abdominal bloating and cramping  History of Present Illness: [place] HunSarah Wideman is a 38 y.o. Caucasian G2P2002 (No LMP recorded.), seen for the above chief complaint. Her past medical history is significant for MTHFR homozygous, hypothyroidism.  Patient had VD in July 2019. Patient is currently still breastfeeding approx q3-4h. [facility] and took home UPT last week and it was negative. She's felt a few times since delivery some cramps like she might have a period but no menses occurred. She's also had occasional bloating and loose stools and constipation, no early satiety or melena. Pt states endocrinologist last check thyroid levels about a month or two ago and it was normal  Review of Systems: as noted in the History of Present Illness.  Past Medical History:  Past Medical History:  Diagnosis Date  . Homozygous for MTHFR gene mutation (HCC) 2012  . Hx of varicella   . Hypothyroidism     Past Surgical History:  Past Surgical History:  Procedure Laterality Date  . WISDOM TOOTH EXTRACTION Left 2007    Past Obstetrical History:  OB History  Gravida Para Term Preterm AB Living  2 2 2     2   SAB TAB Ectopic Multiple Live Births        0 2    # Outcome Date GA Lbr Len/2nd Weight Sex Delivery Anes PTL Lv  2 Term 2018          1 Term 11/30/14 [id number] 39:05 / 03:36 9 lb 1.2 oz (4.115 kg) M Vag-Spont Local, EPI  LIV    Past Gynecological History: As per HPI. History of Pap Smear(s): Yes.   Last pap 2018, which was NILM/HPV neg  Social History:  Social  History   Socioeconomic History  . Marital status: Married    Spouse name: Not on file  . Number of children: Not on file  . Years of education: Not on file  . Highest education level: Not on file  Occupational History  . Not on file  Social Needs  . Financial resource strain: Not on file  . Food insecurity:    Worry: Not on file    Inability: Not on file  . Transportation needs:    Medical: Not on file    Non-medical: Not on file  Tobacco Use  . Smoking status: Never Smoker  . Smokeless tobacco: Never Used  Substance and Sexual Activity  . Alcohol use: No  . Drug use: No  . Sexual activity: Yes    Birth control/protection: None, Condom  Lifestyle  . Physical activity:    Days per week: Not on file    Minutes per session: Not on file  . Stress: Not on file  Relationships  . Social connections:    Talks on phone: Not on file    Gets together: Not on file    Attends religious service: Not on file    Active member of club or organization: Not on file    Attends meetings of clubs or organizations: Not on file    Relationship status: Not on file  . Intimate partner violence:  Fear of current or ex partner: Not on file    Emotionally abused: Not on file    Physically abused: Not on file    Forced sexual activity: Not on file  Other Topics Concern  . Not on file  Social History Narrative  . 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 Stroke Mother   . Cancer Maternal Grandmother        colon    Medications [place] HunSarah Delima had no medications administered during this visit. Current Outpatient Medications  Medication Sig Dispense Refill  . Cobalamine Combinations (VITAMIN B12-FOLIC ACID PO) Take by mouth.    . Prenatal Vit-Fe Fumarate-FA (PRENATAL 1+1 PO) Prenatal    . thyroid (ARMOUR) 30 MG tablet Take 30-60 mg by mouth 2 (two) times daily. 60mg  in the morning and 30 mg at lunch time     No current  facility-administered medications for this visit.     Allergies Patient has no known allergies.   Physical Exam:  BP 108/73   Pulse 95   Wt 211 lb 12.8 oz (96.1 kg)   BMI 37.52 kg/m   Cardiovascular: normal s1 and s2.  No murmurs, rubs or gallops. Respiratory:  Clear to auscultation bilateral. Normal respiratory effort Abdomen: positive bowel sounds and no masses, hernias; diffusely non tender to palpation, non distended Neuro/Psych:  Normal mood and affect.  Skin:  Warm and dry.  Lymphatic:  No inguinal lymphadenopathy.   Pelvic exam: is not limited by body habitus EGBUS: within normal limits, Vagina: within normal limits and with no blood or discharge in the vault, Cervix: normal appearing cervix without tenderness, discharge or lesions. Uterus:  nonenlarged and non tender and Adnexa:  normal adnexa and no mass, fullness, tenderness Rectovaginal: deferred  Laboratory: UPT negative  Radiology: none  Assessment: pt doing well  Plan:  1. Bloating ?IBS. Recommend metamucil and if s/s persist in 875m then may need GI referral  2. Amenorrhea Likely lactational amenorrhea. D/w her still risk of ovulation w/o menses if starting to do more feeding at the q4-5h mark and to be aware of this. If still no menses once she completely ceases breast feeding, pt told to let us knwo   RTC PRN  Cornelia Copaharlie Magon Croson, Jr MD Attending [facility](Faculty Practice)

## 2019-04-08 ENCOUNTER — Other Ambulatory Visit: Payer: Self-pay

## 2019-04-08 ENCOUNTER — Ambulatory Visit (INDEPENDENT_AMBULATORY_CARE_PROVIDER_SITE_OTHER): Payer: Self-pay | Admitting: Obstetrics and Gynecology

## 2019-04-08 VITALS — BP 111/67 | HR 74 | Temp 98.3°F | Wt 212.0 lb

## 2019-04-08 DIAGNOSIS — R1030 Lower abdominal pain, unspecified: Secondary | ICD-10-CM

## 2019-04-08 LAB — POCT URINALYSIS DIP (DEVICE)
Bilirubin Urine: NEGATIVE
Glucose, UA: NEGATIVE mg/dL
Ketones, ur: NEGATIVE mg/dL
Leukocytes,Ua: NEGATIVE
Nitrite: NEGATIVE
Protein, ur: NEGATIVE mg/dL
Specific Gravity, Urine: 1.015 (ref 1.005–1.030)
Urobilinogen, UA: 0.2 mg/dL (ref 0.0–1.0)
pH: 7 (ref 5.0–8.0)

## 2019-04-08 LAB — POCT PREGNANCY, URINE: Preg Test, Ur: NEGATIVE

## 2019-04-08 NOTE — Progress Notes (Signed)
  Obstetrics and Gynecology Visit Return Patient Evaluation  Appointment Date: 04/08/2019  Primary Care Provider: Patient, No Pcp Per  OB[facility]N Clinic: [facility]  Chief Complaint: abdominal pain  History of Present Illness:  Lori Grant is a 38 y.o. with above CC.  Patient notes discomfort about halfway through her cycle and and right before; not impacting ADLs She also has some right sided, occasional vaginal discomfort She also notes some increased urinary frequency and sometimes feels she has to go again right after voiding. +nocturia. She also notes some occasional SUI  Her periods came back in February and are qmonth, regular, no intermenstrual bleeding and not heavy or painful. No lower urinary tract signs or symptoms, no dyspareunia. She is breastfeeding bid  Review of Systems: as noted in the History of Present Illness.  Medications:  Cela Newcom had no medications administered during this visit. Current Outpatient Medications  Medication Sig Dispense Refill  . Cobalamine Combinations (VITAMIN B12-FOLIC ACID PO) Take by mouth.    . Prenatal Vit-Fe Fumarate-FA (PRENATAL 1+1 PO) Prenatal    . thyroid (ARMOUR) 30 MG tablet Take 30 mg by mouth 2 (two) times daily. 120mg  in the morning and 60mg  in afternoon     No current facility-administered medications for this visit.     Allergies: has No Known Allergies.  Physical Exam:  BP 111/67   Pulse 74   Temp 98.3 F (36.8 C)   Wt 212 lb (96.2 kg)   LMP 03/14/2019   BMI 37.55 kg/m  Body mass index is 37.55 kg/m. General appearance: Well nourished, well developed female in no acute distress.  Neuro/Psych:  Normal mood and affect.    Labs: UPT negative  Assessment: pt stable  Plan: I told her that her urinary s/s are very consistent after pregnancy, natural delivery. I recommended kegel exercises and trying not to drink too much water at night and to wait to get up after voiding to see if more  comes out; doesn't have insurance. I also told her that pelvic floor laxity is also likely the cause of the vaginal discomfort, as well.   In terms of the discomfort about halfway through her cycle and right before her period, this is likely related to ovulation and PMS s/s.  RTC: PRN  Durene Romans MD Attending Center for [facility])

## 2019-04-08 NOTE — Patient Instructions (Signed)
Try taking an over the counter NSAID, like naproxen, aleve, motrin for the menstrual discomfort  Try kegel exercises  Avoid too much liquid intake after 7pm

## 2019-04-08 NOTE — Progress Notes (Signed)
For last 3 cycles having pain in RLQ just before ovulation and just before cycle starts. Sharp, pulling pain.

## 2020-08-04 ENCOUNTER — Encounter (INDEPENDENT_AMBULATORY_CARE_PROVIDER_SITE_OTHER): Payer: Self-pay

## 2020-08-17 ENCOUNTER — Other Ambulatory Visit: Payer: Self-pay

## 2020-08-17 ENCOUNTER — Ambulatory Visit (INDEPENDENT_AMBULATORY_CARE_PROVIDER_SITE_OTHER): Payer: Medicaid Other | Admitting: Family Medicine

## 2020-08-17 ENCOUNTER — Encounter (INDEPENDENT_AMBULATORY_CARE_PROVIDER_SITE_OTHER): Payer: Self-pay | Admitting: Family Medicine

## 2020-08-17 VITALS — BP 124/79 | HR 89 | Temp 97.8°F | Ht 63.0 in | Wt 216.0 lb

## 2020-08-17 DIAGNOSIS — Z0289 Encounter for other administrative examinations: Secondary | ICD-10-CM

## 2020-08-17 DIAGNOSIS — E559 Vitamin D deficiency, unspecified: Secondary | ICD-10-CM

## 2020-08-17 DIAGNOSIS — K760 Fatty (change of) liver, not elsewhere classified: Secondary | ICD-10-CM

## 2020-08-17 DIAGNOSIS — R5383 Other fatigue: Secondary | ICD-10-CM | POA: Diagnosis not present

## 2020-08-17 DIAGNOSIS — Z1331 Encounter for screening for depression: Secondary | ICD-10-CM | POA: Diagnosis not present

## 2020-08-17 DIAGNOSIS — E038 Other specified hypothyroidism: Secondary | ICD-10-CM | POA: Diagnosis not present

## 2020-08-17 DIAGNOSIS — Z6838 Body mass index (BMI) 38.0-38.9, adult: Secondary | ICD-10-CM

## 2020-08-17 DIAGNOSIS — R0602 Shortness of breath: Secondary | ICD-10-CM

## 2020-08-17 DIAGNOSIS — Z8632 Personal history of gestational diabetes: Secondary | ICD-10-CM

## 2020-08-18 LAB — COMPREHENSIVE METABOLIC PANEL
ALT: 24 IU/L (ref 0–32)
AST: 18 IU/L (ref 0–40)
Albumin/Globulin Ratio: 1.7 (ref 1.2–2.2)
Albumin: 4.5 g/dL (ref 3.8–4.8)
Alkaline Phosphatase: 102 IU/L (ref 44–121)
BUN/Creatinine Ratio: 17 (ref 9–23)
BUN: 12 mg/dL (ref 6–20)
Bilirubin Total: 0.4 mg/dL (ref 0.0–1.2)
CO2: 25 mmol/L (ref 20–29)
Calcium: 9.8 mg/dL (ref 8.7–10.2)
Chloride: 102 mmol/L (ref 96–106)
Creatinine, Ser: 0.7 mg/dL (ref 0.57–1.00)
GFR calc Af Amer: 128 mL/min/{1.73_m2} (ref >59)
GFR calc non Af Amer: 111 mL/min/{1.73_m2} (ref >59)
Globulin, Total: 2.6 g/dL (ref 1.5–4.5)
Glucose: 86 mg/dL (ref 65–99)
Potassium: 4.4 mmol/L (ref 3.5–5.2)
Sodium: 140 mmol/L (ref 134–144)
Total Protein: 7.1 g/dL (ref 6.0–8.5)

## 2020-08-18 LAB — THYROID PANEL WITH TSH
Free Thyroxine Index: 1.4 (ref 1.2–4.9)
T3 Uptake Ratio: 23 % — ABNORMAL LOW (ref 24–39)
T4, Total: 6.2 ug/dL (ref 4.5–12.0)
TSH: 3.06 u[IU]/mL (ref 0.450–4.500)

## 2020-08-18 LAB — VITAMIN D 25 HYDROXY (VIT D DEFICIENCY, FRACTURES): Vit D, 25-Hydroxy: 54.4 ng/mL (ref 30.0–100.0)

## 2020-08-18 LAB — INSULIN, RANDOM: INSULIN: 11.5 u[IU]/mL (ref 2.6–24.9)

## 2020-08-18 NOTE — Progress Notes (Signed)
Chief Complaint:   OBESITY Lori Grant (MR# [id number]) is a 40 y.o. female who presents for evaluation and treatment of obesity and related comorbidities. Current BMI is Body mass index is 38.26 kg/m. Lori Grant has been struggling with her weight for many years and has been unsuccessful in either losing weight, maintaining weight loss, or reaching her healthy weight goal.  Lori Grant is currently in the action stage of change and ready to dedicate time achieving and maintaining a healthier weight. Lori Grant is interested in becoming our patient and working on intensive lifestyle modifications including (but not limited to) diet and exercise for weight loss.  Lori Grant says she heard about our clinic on a Facebook group.  She does not like some vegetables.  She can eat salad, broccoli, cauliflower, squash, and beets.  For breakfast, coffee with milk and sugar and oatmeal with sausages or 2 eggs (feels satisfied).  For snack, snack bar or dessert.  For lunch, (hungry) deli wrap with 2-3 slices deli meat, pesto spread, and wrap with chips (on a plate) and a granola bar with water (feels satisfied).  Snack is carb (rice cake) with peanut butter and chocolate chips.  For dinner, pasta or protein (4 ounces chicken), carb (3/4 cup rice), and veggie (1/2 cup broccoli) (feels full).  After dinner, she will have a snack of baked goods, cereal, protein bar, or popcorn.  Lori Grant's habits were reviewed today and are as follows: Her family eats meals together, she thinks her family will eat healthier with her, her desired weight loss is 95 pounds, she started gaining weight before being diagnosed with a thyroid problem and pregnancy, her heaviest weight ever was 220 pounds, she craves sweet and salty foods, she snacks frequently in the evenings, she frequently makes poor food choices, she has problems with excessive hunger, she frequently eats larger portions than normal and she struggles with emotional eating.  Depression  Screen Lori Grant's Food and Mood (modified PHQ-9) score was 14.  Depression screen Lori Grant 2/9 08/17/2020  Decreased Interest 3  Down, Depressed, Hopeless 3  PHQ - 2 Score 6  Altered sleeping 1  Tired, decreased energy 2  Change in appetite 1  Feeling bad or failure about yourself  3  Trouble concentrating 1  Moving slowly or fidgety/restless 0  Suicidal thoughts 0  PHQ-9 Score 14  Difficult doing work/chores Somewhat difficult   Subjective:   1. Other fatigue Lori Grant admits to daytime somnolence and denies waking up still tired. Patent has a history of symptoms of daytime fatigue. Lori Grant generally gets 6-8 hours of sleep per night, and states that she has poor quality sleep. Snoring is not present. Apneic episodes are not present. Epworth Sleepiness Score is 6.  EKG - NSR at 83 bpm.  2. SOBOE (shortness of breath on exertion) Lori Grant notes increasing shortness of breath with exercising and seems to be worsening over time with weight gain. She notes getting out of breath sooner with activity than she used to. This has gotten worse recently. Lori Grant denies shortness of breath at rest or orthopnea.  3. Other specified hypothyroidism Seeing Lori Sole, MD with [facility].  4. Vitamin D deficiency No nausea, vomiting, muscle weakness.  She endorses fatigue.  On 7,000 IU per day.  5. NAFLD (nonalcoholic fatty liver disease) LFTs normal at last check.  U/S showing diffuse fatty infiltration.   6. History of gestational diabetes Refpeat labs postpartum within normal limits.  Last A1c 06/2020 4.8.  7. Depression screening Lori Grant was  screened for depression as part of her new patient workup today.  PHQ-9 is 14.  Assessment/Plan:   1. Other fatigue Lori Grant does feel that her weight is causing her energy to be lower than it should be. Fatigue may be related to obesity, depression or many other causes. Labs will be ordered, and in the meanwhile, Lori Grant will focus on self care including  making healthy food choices, increasing physical activity and focusing on stress reduction.  - EKG 12-Lead - Comprehensive metabolic panel  2. SOBOE (shortness of breath on exertion) Lori Grant does feel that she gets out of breath more easily that she used to when she exercises. [name]'s shortness of breath appears to be obesity related and exercise induced. She has agreed to work on weight loss and gradually increase exercise to treat her exercise induced shortness of breath. Will continue to monitor closely.  3. Other specified hypothyroidism Will check thyroid panel today.  - Thyroid Panel With TSH  4. Vitamin D deficiency Low Vitamin D level contributes to fatigue and are associated with obesity, breast, and colon cancer.  Will check vitamin D level today.  - VITAMIN D 25 Hydroxy (Vit-D Deficiency, Fractures)  5. NAFLD (nonalcoholic fatty liver disease) We discussed the likely diagnosis of non-alcoholic fatty liver disease today and how this condition is obesity related. Lori Grant was educated the importance of weight loss. Lori Grant agreed to continue with her weight loss efforts with healthier diet and exercise as an essential part of her treatment plan.  Check CMP today.  6. History of gestational diabetes Will check insulin level today, as per below.  - Insulin, random  7. Depression screening Lori Grant had a positive depression screening. Depression is commonly associated with obesity and often results in emotional eating behaviors. We will monitor this closely and work on CBT to help improve the non-hunger eating patterns. Referral to Psychology may be required if no improvement is seen as she continues in our clinic.  8. Class 2 severe obesity with serious comorbidity and body mass index (BMI) of 38.0 to 38.9 in adult, unspecified obesity type Lori Grant)  Lori Grant is currently in the action stage of change and her goal is to continue with weight loss efforts. I recommend Lori Grant begin the structured  treatment plan as follows:  She has agreed to the Category 4 Plan.  Exercise goals: No exercise has been prescribed at this time.   Behavioral modification strategies: increasing lean protein intake, meal planning and cooking strategies, keeping healthy foods in the home and planning for success.  She was informed of the importance of frequent follow-up visits to maximize her success with intensive lifestyle modifications for her multiple health conditions. She was informed we would discuss her lab results at her next visit unless there is a critical issue that needs to be addressed sooner. Lori Grant agreed to keep her next visit at the agreed upon time to discuss these results.  Objective:   Blood pressure 124/79, pulse 89, temperature 97.8 F (36.6 C), temperature source Oral, height 5\' 3"  (1.6 m), weight 216 lb (98 kg), last menstrual period 08/03/2020, SpO2 96 %. Body mass index is 38.26 kg/m.  EKG: Normal sinus rhythm, rate 83 bpm.  Indirect Calorimeter completed today shows a VO2 of 302 and a REE of 2103.  Her calculated basal metabolic rate is 2104 thus her basal metabolic rate is better than expected.  General: Cooperative, alert, well developed, in no acute distress. HEENT: Conjunctivae and lids unremarkable. Cardiovascular: Regular rhythm.  Lungs: Normal  work of breathing. Neurologic: No focal deficits.   Lab Results  Component Value Date   CREATININE 0.70 08/17/2020   BUN 12 08/17/2020   NA 140 08/17/2020   K 4.4 08/17/2020   CL 102 08/17/2020   CO2 25 08/17/2020   Lab Results  Component Value Date   ALT 24 08/17/2020   AST 18 08/17/2020   ALKPHOS 102 08/17/2020   BILITOT 0.4 08/17/2020   Lab Results  Component Value Date   INSULIN 11.5 08/17/2020   Lab Results  Component Value Date   TSH 3.060 08/17/2020   Lab Results  Component Value Date   WBC 5.4 01/20/2017   HGB 14.0 01/20/2017   HCT 39.6 01/20/2017   MCV 85 01/20/2017   PLT 251 01/20/2017    Attestation Statements:   Reviewed by clinician on day of visit: allergies, medications, problem list, medical history, surgical history, family history, social history, and previous encounter notes.  This is the patient's first visit at [facility]. The patient's NEW PATIENT PACKET was reviewed at length. Included in the packet: current and past health history, medications, allergies, ROS, gynecologic history (women only), surgical history, family history, social history, weight history, weight loss surgery history (for those that have had weight loss surgery), nutritional evaluation, mood and food questionnaire, PHQ9, Epworth questionnaire, sleep habits questionnaire, patient life and health improvement goals questionnaire. These will all be scanned into the patient's chart under media.   During the visit, I independently reviewed the patient's EKG, bioimpedance scale results, and indirect calorimeter results. I used this information to tailor a meal plan for the patient that will help her to lose weight and will improve her obesity-related conditions going forward. I performed a medically necessary appropriate examination and/or evaluation. I discussed the assessment and treatment plan with the patient. The patient was provided an opportunity to ask questions and all were answered. The patient agreed with the plan and demonstrated an understanding of the instructions. Labs were ordered at this visit and will be reviewed at the next visit unless more critical results need to be addressed immediately. Clinical information was updated and documented in the EMR.   Time spent on visit including pre-visit chart review and post-visit care was 60 minutes.   A separate 15 minutes was spent on risk counseling (see above).   I, Insurance claims handler, CMA, am acting as transcriptionist for Reuben Likes, MD.  I have reviewed the above documentation for accuracy and completeness, and I agree with  the above. - Katherina Mires, MD

## 2020-08-31 ENCOUNTER — Ambulatory Visit (INDEPENDENT_AMBULATORY_CARE_PROVIDER_SITE_OTHER): Payer: Self-pay | Admitting: Family Medicine

## 2022-02-23 ENCOUNTER — Encounter (INDEPENDENT_AMBULATORY_CARE_PROVIDER_SITE_OTHER): Payer: Self-pay

## 2022-05-07 ENCOUNTER — Encounter (HOSPITAL_BASED_OUTPATIENT_CLINIC_OR_DEPARTMENT_OTHER): Payer: Self-pay | Admitting: Emergency Medicine

## 2022-05-07 ENCOUNTER — Emergency Department (HOSPITAL_BASED_OUTPATIENT_CLINIC_OR_DEPARTMENT_OTHER)
Admission: EM | Admit: 2022-05-07 | Discharge: 2022-05-07 | Disposition: A | Discharge status: Home / Self Care | Payer: Medicaid Other | Attending: Emergency Medicine | Admitting: Emergency Medicine

## 2022-05-07 ENCOUNTER — Emergency Department (HOSPITAL_BASED_OUTPATIENT_CLINIC_OR_DEPARTMENT_OTHER): Payer: Medicaid Other

## 2022-05-07 ENCOUNTER — Other Ambulatory Visit: Payer: Self-pay

## 2022-05-07 DIAGNOSIS — R110 Nausea: Secondary | ICD-10-CM | POA: Diagnosis not present

## 2022-05-07 DIAGNOSIS — R350 Frequency of micturition: Secondary | ICD-10-CM | POA: Insufficient documentation

## 2022-05-07 DIAGNOSIS — R824 Acetonuria: Secondary | ICD-10-CM | POA: Diagnosis not present

## 2022-05-07 DIAGNOSIS — E039 Hypothyroidism, unspecified: Secondary | ICD-10-CM | POA: Diagnosis not present

## 2022-05-07 DIAGNOSIS — R140 Abdominal distension (gaseous): Secondary | ICD-10-CM | POA: Diagnosis not present

## 2022-05-07 DIAGNOSIS — R109 Unspecified abdominal pain: Secondary | ICD-10-CM

## 2022-05-07 DIAGNOSIS — R102 Pelvic and perineal pain: Secondary | ICD-10-CM | POA: Insufficient documentation

## 2022-05-07 LAB — URINALYSIS, MICROSCOPIC (REFLEX)

## 2022-05-07 LAB — URINALYSIS, ROUTINE W REFLEX MICROSCOPIC
Bilirubin Urine: NEGATIVE
Glucose, UA: NEGATIVE mg/dL
Ketones, ur: 40 mg/dL — AB
Leukocytes,Ua: NEGATIVE
Nitrite: NEGATIVE
Protein, ur: NEGATIVE mg/dL
Specific Gravity, Urine: >=1.03 (ref 1.005–1.030)
pH: 6 (ref 5.0–8.0)

## 2022-05-07 LAB — PREGNANCY, URINE: Preg Test, Ur: NEGATIVE

## 2022-05-07 MED ORDER — ACETAMINOPHEN 325 MG PO TABS
650.0000 mg | ORAL_TABLET | Freq: Once | ORAL | Status: DC
  Filled 2022-05-07: qty 2

## 2022-05-07 NOTE — ED Triage Notes (Signed)
Patient c/o pelvic pain with nausea and increased urination x 3 days.

## 2022-05-07 NOTE — Discharge Instructions (Addendum)
Today your ultrasound showed a possible follicle on your left ovary.  Please follow-up with your OB/GYN, PCP, for your symptoms.  This may be related to your hormones.  Please return to the ER if you see to have severe abdominal pain, pelvic pain, uncontrollable nausea and vomiting.  Also your urine was sent off for culture, to further evaluate your urinary symptoms.

## 2022-05-07 NOTE — ED Notes (Signed)
Lab said they would run the urine culture off the urine sample already given

## 2022-05-07 NOTE — ED Provider Notes (Signed)
[place] EMERGENCY DEPARTMENT Provider Note   CSN: [id number] Arrival date & time: 05/07/22  1138     History  Chief Complaint  Patient presents with   Pelvic Pain    Lori Grant is a 41 y.o. female, history of hypothyroidism, who presents to the ED secondary to increased bloating, pelvic discomfort, pain with ovulation.  She states for the last couple months, she developed increased bloating, nausea, decreased frequency of bowels, and pain.  She notes that she is mostly concerned about ovarian cancer, has had 5 pregnancies 3 live complete term births.  Denies any loss of weight, endorses gain of weight.  Also endorses increased urination, frequency, urgency, burning with urination for the last couple days.  No concern for STDs no vaginal discharge.  No back pain or abdominal pain.     Home Medications Prior to Admission medications   Medication Sig Start Date End Date Taking? Authorizing Provider  Ascorbic Acid (VITA-C PO) Take 1,040 mg by mouth.    [provider]  Cholecalciferol (VITAMIN D3 PO) Take 700 Int'l Units/1.45m2 by mouth.    [provider]  Nutritional Supplements (NUTRITIONAL SUPPLEMENT PO) Take 500 mg by mouth. Sunflower lectin    [provider]  Nutritional Supplements (NUTRITIONAL SUPPLEMENT PO) Take by mouth. Magnesium lotion    [provider]  Prenatal Vit-Fe Fumarate-FA (PRENATAL 1+1 PO) Prenatal    [provider]  thyroid (ARMOUR) 60 MG tablet Take 60 mg by mouth 2 (two) times daily. 120mg  in the morning and 60mg  in afternoon    [provider]  Zinc 50 MG CAPS Take 50 mg by mouth.    [provider]      Allergies    Patient has no known allergies.    Review of Systems   Review of Systems  Constitutional:  Negative for chills and fever.  Gastrointestinal:  Positive for nausea.  Genitourinary:  Positive for dysuria, pelvic pain and urgency. Negative for flank pain.     Physical Exam Updated Vital Signs BP 106/74   Pulse 100   Temp 98.1 F (36.7 C) (Oral)   Resp 17   Ht 5\' 3"  (1.6 m)   Wt 99.8 kg   LMP 04/22/2022   SpO2 95%   BMI 38.97 kg/m  Physical Exam Vitals and nursing note reviewed.  Constitutional:      General: She is not in acute distress.    Appearance: Normal appearance. She is well-developed.  HENT:     Head: Normocephalic and atraumatic.     Nose: Nose normal.     Mouth/Throat:     Mouth: Mucous membranes are moist.  Eyes:     General:        Right eye: No discharge.        Left eye: No discharge.     Extraocular Movements: Extraocular movements intact.     Conjunctiva/sclera: Conjunctivae normal.     Pupils: Pupils are equal, round, and reactive to light.  Cardiovascular:     Rate and Rhythm: Normal rate and regular rhythm.  Pulmonary:     Effort: No respiratory distress.  Abdominal:     Comments: +bloated abdomen, no ttp  Skin:    General: Skin is warm and dry.     Capillary Refill: Capillary refill takes less than 2 seconds.  Neurological:     Mental Status: She is alert.     Comments: Clear speech.   Psychiatric:  Behavior: Behavior normal.        Thought Content: Thought content normal.     ED Results / Procedures / Treatments   Labs (all labs ordered are listed, but only abnormal results are displayed) Labs Reviewed  URINALYSIS, ROUTINE W REFLEX MICROSCOPIC - Abnormal; Notable for the following components:      Result Value   Hgb urine dipstick Raynell Upton (*)    Ketones, ur 40 (*)    All other components within normal limits  URINALYSIS, MICROSCOPIC (REFLEX) - Abnormal; Notable for the following components:   Bacteria, UA FEW (*)    All other components within normal limits  URINE CULTURE  PREGNANCY, URINE    EKG None  Radiology US PELVIS (TRANSABDOMINAL ONLY)  Result Date: 05/07/2022 CLINICAL DATA:  Pelvic pain, nausea EXAM: TRANSABDOMINAL ULTRASOUND OF PELVIS DOPPLER ULTRASOUND OF  OVARIES TECHNIQUE: Transabdominal ultrasound examination of the pelvis was performed including evaluation of the uterus, ovaries, adnexal regions, and pelvic cul-de-sac. Color and duplex Doppler ultrasound was utilized to evaluate blood flow to the ovaries. COMPARISON:  04/11/2016 FINDINGS: Uterus Measurements: 9.7 x 5.1 x 5 cm = volume: 130 mL. No fibroids or other mass visualized. Endometrium Thickness: 7.3 mm.  No focal abnormality visualized. Right ovary Measurements: 3.5 x 2 x 2.7 cm = volume: 10 mL. Normal appearance/no adnexal mass. Left ovary Measurements: 2 x 2.6 x 2.2 cm = volume: 5 x 9 mL. These 1.7 cm anechoic structure, possibly a follicle. Pulsed Doppler evaluation demonstrates normal low-resistance arterial and venous waveforms in both ovaries. Other: No free fluid is seen. IMPRESSION: No sonographic abnormalities are seen in pelvis. Electronically Signed   By: Elmer Picker M.D.   On: 05/07/2022 16:01    Procedures Procedures   Medications Ordered in ED Medications  acetaminophen (TYLENOL) tablet 650 mg (650 mg Oral Patient Refused/Not Given 05/07/22 1643)    ED Course/ Medical Decision Making/ A&P                           Medical Decision Making 41 year old female, history of hyperthyroidism, who presents to the ED secondary to bloating of the abdomen, increased stools, and urinary symptoms.  She states she is concerned she may have ovarian cancer, and wants further evaluation, she is mildly bloated, on exam, good bowel sounds however.  She has no abdominal tenderness to palpation or flank pain.  We will obtain a pelvic ultrasound for further evaluation given patient's concern.  Also obtain a UA for further evaluation of the urinary symptoms.  Amount and/or Complexity of Data Reviewed Labs: ordered.    Details: Labs show few bacteria, Cornelious Diven hemoglobin, and ketones possible dehydration with contamination?  Will send for culture Radiology: ordered.    Details: Ultrasound  shows 1.7 anechoic structural in the left ovary, possible follicle. Discussion of management or test interpretation with external provider(s): Discussed with patient findings, culture pending, she does have some vaginal dryness, so this could be the reason for her symptoms.  Discussed ultrasound, possible ovarian follicle which would make sense as she is ovulating at this time, she voiced understanding will follow-up with her OB/GYN.  Return precautions emphasized, she states she is in minimal to no pain right now, would like discharge.  Discharge instructions provided.  Risk OTC drugs.    Final Clinical Impression(s) / ED Diagnoses Final diagnoses:  Abdominal bloating with cramps    Rx / DC Orders ED Discharge Orders     None  Osvaldo Shipper, PA 05/07/22 1914    Kemper Durie, DO 05/08/22 [phone]

## 2022-05-09 LAB — URINE CULTURE

## 2023-01-10 ENCOUNTER — Other Ambulatory Visit: Payer: Self-pay | Admitting: Nurse Practitioner

## 2023-01-10 DIAGNOSIS — Z0000 Encounter for general adult medical examination without abnormal findings: Secondary | ICD-10-CM

## 2023-01-13 ENCOUNTER — Ambulatory Visit: Payer: Medicaid Other

## 2023-02-13 ENCOUNTER — Ambulatory Visit: Payer: Medicaid Other

## 2023-02-17 ENCOUNTER — Ambulatory Visit: Payer: Medicaid Other

## 2024-04-10 ENCOUNTER — Ambulatory Visit: Admitting: Physician Assistant

## 2024-04-17 ENCOUNTER — Ambulatory Visit: Admitting: Physician Assistant

## 2024-10-02 ENCOUNTER — Ambulatory Visit: Admitting: Physician Assistant
